# Patient Record
Sex: Female | Born: 1965 | Race: White | Hispanic: No | Marital: Single | Smoking: Never smoker
Health system: Southern US, Community
[De-identification: ages and names within clinical notes are randomized; demographics above are authoritative.]

## PROBLEM LIST (undated history)

## (undated) DIAGNOSIS — F419 Anxiety disorder, unspecified: Secondary | ICD-10-CM

## (undated) DIAGNOSIS — F32A Depression, unspecified: Secondary | ICD-10-CM

## (undated) DIAGNOSIS — F1010 Alcohol abuse, uncomplicated: Secondary | ICD-10-CM

## (undated) DIAGNOSIS — F329 Major depressive disorder, single episode, unspecified: Secondary | ICD-10-CM

## (undated) HISTORY — DX: Anxiety disorder, unspecified: F41.9

## (undated) HISTORY — DX: Alcohol abuse, uncomplicated: F10.10

## (undated) HISTORY — DX: Depression, unspecified: F32.A

---
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
## 1998-09-24 ENCOUNTER — Emergency Department (HOSPITAL_COMMUNITY): Admission: EM | Admit: 1998-09-24 | Discharge: 1998-09-24 | Payer: Self-pay | Admitting: Emergency Medicine

## 1998-09-24 ENCOUNTER — Encounter: Payer: Self-pay | Admitting: Emergency Medicine

## 1999-05-04 ENCOUNTER — Other Ambulatory Visit: Admission: RE | Admit: 1999-05-04 | Discharge: 1999-05-04 | Payer: Self-pay | Admitting: Obstetrics & Gynecology

## 2000-07-09 ENCOUNTER — Other Ambulatory Visit: Admission: RE | Admit: 2000-07-09 | Discharge: 2000-07-09 | Payer: Self-pay | Admitting: Obstetrics & Gynecology

## 2001-07-23 ENCOUNTER — Other Ambulatory Visit: Admission: RE | Admit: 2001-07-23 | Discharge: 2001-07-23 | Payer: Self-pay | Admitting: Obstetrics & Gynecology

## 2002-08-21 ENCOUNTER — Other Ambulatory Visit: Admission: RE | Admit: 2002-08-21 | Discharge: 2002-08-21 | Payer: Self-pay | Admitting: Obstetrics & Gynecology

## 2003-04-01 ENCOUNTER — Ambulatory Visit (HOSPITAL_COMMUNITY): Admission: RE | Admit: 2003-04-01 | Discharge: 2003-04-01 | Payer: Self-pay | Admitting: Obstetrics & Gynecology

## 2003-05-23 DIAGNOSIS — N8003 Adenomyosis of the uterus: Secondary | ICD-10-CM

## 2003-05-23 DIAGNOSIS — N800 Endometriosis of uterus: Secondary | ICD-10-CM

## 2003-05-23 HISTORY — DX: Adenomyosis of the uterus: N80.03

## 2003-05-23 HISTORY — PX: LAPAROSCOPY: SHX197

## 2003-05-23 HISTORY — DX: Endometriosis of uterus: N80.0

## 2003-08-27 ENCOUNTER — Other Ambulatory Visit: Admission: RE | Admit: 2003-08-27 | Discharge: 2003-08-27 | Payer: Self-pay | Admitting: Obstetrics & Gynecology

## 2004-11-16 ENCOUNTER — Other Ambulatory Visit: Admission: RE | Admit: 2004-11-16 | Discharge: 2004-11-16 | Payer: Self-pay | Admitting: Obstetrics & Gynecology

## 2011-03-27 ENCOUNTER — Other Ambulatory Visit: Payer: Self-pay | Admitting: Hematology and Oncology

## 2018-04-23 ENCOUNTER — Encounter: Payer: Self-pay | Admitting: Emergency Medicine

## 2018-04-23 DIAGNOSIS — F419 Anxiety disorder, unspecified: Secondary | ICD-10-CM

## 2018-04-23 DIAGNOSIS — F329 Major depressive disorder, single episode, unspecified: Secondary | ICD-10-CM | POA: Insufficient documentation

## 2018-04-23 DIAGNOSIS — F4310 Post-traumatic stress disorder, unspecified: Secondary | ICD-10-CM | POA: Insufficient documentation

## 2018-05-01 ENCOUNTER — Ambulatory Visit (INDEPENDENT_AMBULATORY_CARE_PROVIDER_SITE_OTHER): Payer: No Typology Code available for payment source | Admitting: Psychiatry

## 2018-05-01 DIAGNOSIS — Z8659 Personal history of other mental and behavioral disorders: Secondary | ICD-10-CM

## 2018-05-01 DIAGNOSIS — F325 Major depressive disorder, single episode, in full remission: Secondary | ICD-10-CM | POA: Diagnosis not present

## 2018-05-01 DIAGNOSIS — F1011 Alcohol abuse, in remission: Secondary | ICD-10-CM | POA: Diagnosis not present
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
## 2018-05-03 ENCOUNTER — Ambulatory Visit: Payer: Self-pay | Admitting: Psychiatry

## 2018-05-07 ENCOUNTER — Ambulatory Visit: Payer: No Typology Code available for payment source | Admitting: Psychiatry

## 2018-05-07 DIAGNOSIS — F419 Anxiety disorder, unspecified: Secondary | ICD-10-CM

## 2018-05-07 DIAGNOSIS — F4310 Post-traumatic stress disorder, unspecified: Secondary | ICD-10-CM

## 2018-05-07 DIAGNOSIS — F325 Major depressive disorder, single episode, in full remission: Secondary | ICD-10-CM | POA: Diagnosis not present

## 2018-05-07 MED ORDER — VILAZODONE HCL 40 MG PO TABS: 40.0000 mg | ORAL_TABLET | Freq: Every day | ORAL | 3 refills | Status: DC

## 2018-05-07 MED ORDER — PRAZOSIN HCL 2 MG PO CAPS: 2.0000 mg | ORAL_CAPSULE | Freq: Every day | ORAL | 3 refills | Status: DC

## 2018-05-07 MED ORDER — BUPROPION HCL ER (XL) 150 MG PO TB24: 150.0000 mg | ORAL_TABLET | Freq: Every day | ORAL | 3 refills | Status: DC

## 2018-05-07 NOTE — Progress Notes (Signed)
[facility]  Patient ID: Amanda Dillon,  MRN: [id number]  PCP: Donald Prose, MD  Date of Evaluation: 05/07/2018 Time spent:20 minutes  Chief Complaint:   HISTORY/CURRENT STATUS: HPI patient seen back in June of this year.  She was doing well.  No change.  She was going to run a marathon in [place] in October of this year.  Her job she has a psychiatric Education officer, museum at [employer]. She continues to do well.  Individual Medical History/ Review of Systems: Changes? :No   Allergies: Patient has no allergy information on record.  Current Medications:  Current Outpatient Medications:  .  buPROPion (WELLBUTRIN XL) 150 MG 24 hr tablet, Take 1 tablet (150 mg total) by mouth daily., Disp: 30 tablet, Rfl: 3 .  prazosin (MINIPRESS) 2 MG capsule, Take 1 capsule (2 mg total) by mouth at bedtime., Disp: 30 capsule, Rfl: 3 .  Vilazodone HCl (VIIBRYD) 40 MG TABS, Take 1 tablet (40 mg total) by mouth daily., Disp: 30 tablet, Rfl: 3 Medication Side Effects: none  Family Medical/ Social History: Changes? No  MENTAL HEALTH EXAM:  There were no vitals taken for this visit.There is no height or weight on file to calculate BMI.  General Appearance: Casual  Eye Contact:  Good  Speech:  Normal Rate  Volume:  Normal  Mood:  Euthymic  Affect:  Appropriate  Thought Process:  Goal Directed  Orientation:  Full (Time, Place, and Person)  Thought Content: WDL   Suicidal Thoughts:  No  Homicidal Thoughts:  No  Memory:  WNL  Judgement:  Good  Insight:  Good  Psychomotor Activity:  Normal  Concentration:  Concentration: Good  Recall:  Good  Fund of Knowledge: Good  Language: Good  Assets:  Desire for Improvement  ADL's:  Intact  Cognition: WNL  Prognosis:  Good    DIAGNOSES:    ICD-10-CM   1. Major depressive disorder in remission, unspecified whether recurrent ([place]) F32.5   2. PTSD (post-traumatic stress disorder) F43.10   3. Anxiety F41.9     Receiving Psychotherapy:  Yes    RECOMMENDATIONS: Ms. she will continue her current treatment regimen and I will see her back in 4 months.   Comer Locket, PA-C

## 2018-07-31 ENCOUNTER — Ambulatory Visit (INDEPENDENT_AMBULATORY_CARE_PROVIDER_SITE_OTHER): Payer: No Typology Code available for payment source | Admitting: Psychiatry

## 2018-07-31 ENCOUNTER — Other Ambulatory Visit: Payer: Self-pay

## 2018-07-31 DIAGNOSIS — Z8659 Personal history of other mental and behavioral disorders: Secondary | ICD-10-CM | POA: Diagnosis not present

## 2018-07-31 DIAGNOSIS — F325 Major depressive disorder, single episode, in full remission: Secondary | ICD-10-CM | POA: Diagnosis not present

## 2018-07-31 DIAGNOSIS — F1011 Alcohol abuse, in remission: Secondary | ICD-10-CM | POA: Diagnosis not present
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
## 2018-09-05 ENCOUNTER — Ambulatory Visit: Payer: PRIVATE HEALTH INSURANCE | Admitting: Psychiatry

## 2018-09-09 ENCOUNTER — Other Ambulatory Visit: Payer: Self-pay | Admitting: Psychiatry

## 2018-09-11 ENCOUNTER — Ambulatory Visit (INDEPENDENT_AMBULATORY_CARE_PROVIDER_SITE_OTHER): Payer: No Typology Code available for payment source | Admitting: Psychiatry

## 2018-09-11 ENCOUNTER — Other Ambulatory Visit: Payer: Self-pay

## 2018-09-11 DIAGNOSIS — F1011 Alcohol abuse, in remission: Secondary | ICD-10-CM

## 2018-09-11 DIAGNOSIS — Z8659 Personal history of other mental and behavioral disorders: Secondary | ICD-10-CM

## 2018-09-11 DIAGNOSIS — F325 Major depressive disorder, single episode, in full remission: Secondary | ICD-10-CM

## 2018-09-11 DIAGNOSIS — Z724 Inappropriate diet and eating habits: Secondary | ICD-10-CM | POA: Diagnosis not present

## 2018-09-11 NOTE — Progress Notes (Signed)
Psychotherapy Progress Note [facility] Luan Moore, PhD LP  Patient ID: Amanda Dillon     MRN: [id number]     Therapy format: Individual psychotherapy Date: 09/11/2018     Start: 6:07p Stop: 6:43p Time Spent: 36 min  Telehealth visit I connected with patient by a video enabled telemedicine/telehealth application or telephone, with her informed consent, and verified patient privacy and that I am speaking with the correct person using two identifiers.  I was located at my home and patient at her workplace.  We discussed the limitations, risks, and security and privacy concerns associated with telehealth services and the availability of in-person appointments, including awareness that she may be responsible for charges related to the service, and she expressed understanding and agreed to proceed.  I discussed treatment planning with her, with opportunity to ask and answer all questions. Agreed with the plan, demonstrated an understanding of the instructions, and made her aware to call our office if symptoms worsen or she feels she is in a crisis state and needs immediate contact.  Session narrative -- presenting needs, interim history, self-report of stressors and symptoms, applications of prior therapy, status changes, and interventions made in session Scheduled early to deal with the death of her 9-year psychiatrist, Amanda Dillon, [place].  Been depression-free for so long now, still has the nagging doubt that she could relapse, though she has been very stable for a few years now, realizes she felt that having him and Amanda Dillon for support has been part of her assurance.  Provided reassurance that she has in fact learned the things she needs and cannot relapse as imagined without some unimagined combination of catastrophes to shake her memory, her confidence, her lifestyle, her sense of worth, and faith all at the same time.  Deeply appreciative of the work done and success together  overcoming alcoholism, depression, PTSD, social anxiety, longstanding low self-esteem, overeating, family mistrust, and finding forgiveness for her father.  Has offered to office mgr to contribute to a patient assistance fund if it exists.  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  More strenuous set of expectations with her promotion and the transition stretching to cover two jobs.  Seeing cause to tighten up diet and exercise habits as she has done some stress eating and weight is creeping up.  Discussed measures to reduce both carbs and carb cravings and encouraged to make time for walking if not running.  Therapeutic modalities: Cognitive Behavioral Therapy and Humanistic/Existential  Mental Status/Observations:  Appearance:   Neat     Behavior:  Appropriate  Motor:  Normal  Speech/Language:   Clear and Coherent  Affect:  Appropriate  Mood:  normal and appropriately saddened  Thought process:  normal  Thought content:    WNL  Sensory/Perceptual disturbances:    WNL  Orientation:  grossly intact  Attention:  Good  Concentration:  Good  Memory:  WNL  Insight:    Good  Judgment:   Good  Impulse Control:  Good   Risk Assessment: Danger to Self:  No Self-injurious Behavior: No Danger to Others: No Duty to Warn:no Physical Aggression / Violence:No  Access to Firearms a concern: No   Diagnosis:   ICD-10-CM   1. Major depressive disorder in full remission, unspecified whether recurrent ([place]) F32.5   2. History of posttraumatic stress disorder (PTSD) Z86.59   3. History of dysthymia Z86.59   4. History of alcohol abuse F10.11  Assessment of progress:  improving  Plan:  . Walking, if not running . Carb and portion control . Open invitation to write or make memorial in Amanda Dillon honor, office will process . Other recommendations/advice as noted above . Continue to utilize previously learned skills ad lib . Maintain  medication as prescribed and work faithfully with relevant prescriber(s) if any changes are desired or seem indicated . Call the clinic on-call service, present to ER, or call 911 if any life-threatening psychiatric crisis Return in about 2 months (around 11/11/2018) for will call.   [name], PhD [place] Licensed Psychologist

## 2018-10-23 ENCOUNTER — Other Ambulatory Visit: Payer: Self-pay

## 2018-10-23 MED ORDER — BUPROPION HCL ER (XL) 150 MG PO TB24: 150.0000 mg | ORAL_TABLET | Freq: Every day | ORAL | 0 refills | Status: DC

## 2018-10-28 ENCOUNTER — Encounter: Payer: Self-pay | Admitting: Physician Assistant

## 2018-10-28 ENCOUNTER — Ambulatory Visit (INDEPENDENT_AMBULATORY_CARE_PROVIDER_SITE_OTHER): Payer: No Typology Code available for payment source | Admitting: Physician Assistant

## 2018-10-28 ENCOUNTER — Other Ambulatory Visit: Payer: Self-pay

## 2018-10-28 DIAGNOSIS — F325 Major depressive disorder, single episode, in full remission: Secondary | ICD-10-CM

## 2018-10-28 DIAGNOSIS — F4310 Post-traumatic stress disorder, unspecified: Secondary | ICD-10-CM

## 2018-10-28 DIAGNOSIS — F1011 Alcohol abuse, in remission: Secondary | ICD-10-CM

## 2018-10-28 MED ORDER — PRAZOSIN HCL 2 MG PO CAPS: 2.0000 mg | ORAL_CAPSULE | Freq: Every day | ORAL | 1 refills | Status: DC

## 2018-10-28 MED ORDER — BUPROPION HCL ER (XL) 150 MG PO TB24: 150.0000 mg | ORAL_TABLET | Freq: Every day | ORAL | 1 refills | Status: DC

## 2018-10-28 MED ORDER — VILAZODONE HCL 40 MG PO TABS: 40.0000 mg | ORAL_TABLET | Freq: Every day | ORAL | 1 refills | Status: DC

## 2018-10-28 NOTE — Progress Notes (Signed)
[facility]  Patient ID: Amanda Dillon,  MRN: [id number]  PCP: Donald Prose, MD  Date of Evaluation: 10/28/2018 Time spent:15 minutes  Chief Complaint:  Chief Complaint    Follow-up     Virtual Visit via Telephone Note  I connected with patient by a video enabled telemedicine application or telephone, with their informed consent, and verified patient privacy and that I am speaking with the correct person using two identifiers.  I am private, in my office and the patient is at work.  I discussed the limitations, risks, security and privacy concerns of performing an evaluation and management service by telephone and the availability of in person appointments. I also discussed with the patient that there may be a patient responsible charge related to this service. The patient expressed understanding and agreed to proceed.   I discussed the assessment and treatment plan with the patient. The patient was provided an opportunity to ask questions and all were answered. The patient agreed with the plan and demonstrated an understanding of the instructions.   The patient was advised to call back or seek an in-person evaluation if the symptoms worsen or if the condition fails to improve as anticipated.  I provided 15 minutes of non-face-to-face time during this encounter.  HISTORY/CURRENT STATUS: HPI For 6 month med check.  Transferring to my care because her provider and my colleague, Comer Locket, [place], passed away recently.  "I can't remember the last time I was depressed.  It feels good to be able to say that!"  She is able to enjoy things.  Energy and motivation are good.  She sleeps well.  Does not cry easily.  Denies nightmares.  Denies dizziness, syncope, seizures, numbness, tingling, tremor, tics, unsteady gait, slurred speech, confusion. Denies muscle or joint pain, stiffness, or dystonia.  Individual Medical History/ Review of Systems: Changes? :No    Past medications for  mental health diagnoses include: unknown  Allergies: Patient has no known allergies.  Current Medications:  Current Outpatient Medications:  .  buPROPion (WELLBUTRIN XL) 150 MG 24 hr tablet, Take 1 tablet (150 mg total) by mouth daily., Disp: 90 tablet, Rfl: 1 .  prazosin (MINIPRESS) 2 MG capsule, Take 1 capsule (2 mg total) by mouth at bedtime., Disp: 90 capsule, Rfl: 1 .  Vilazodone HCl (VIIBRYD) 40 MG TABS, Take 1 tablet (40 mg total) by mouth daily., Disp: 90 tablet, Rfl: 1 Medication Side Effects: none  Family Medical/ Social History: Changes? No  MENTAL HEALTH EXAM:  There were no vitals taken for this visit.There is no height or weight on file to calculate BMI.  General Appearance: Unable to assess  Eye Contact:  Unable to assess  Speech:  Clear and Coherent  Volume:  Normal  Mood:  Euthymic  Affect:  Unable to assess  Thought Process:  Goal Directed  Orientation:  Full (Time, Place, and Person)  Thought Content: Logical   Suicidal Thoughts:  No  Homicidal Thoughts:  No  Memory:  WNL  Judgement:  Good  Insight:  Good  Psychomotor Activity:  Unable to assess  Concentration:  Concentration: Good  Recall:  Good  Fund of Knowledge: Good  Language: Good  Assets:  Desire for Improvement  ADL's:  Intact  Cognition: WNL  Prognosis:  Good    DIAGNOSES:    ICD-10-CM   1. Major depressive disorder in full remission, unspecified whether recurrent ([place]) F32.5   2. History of alcohol abuse F10.11   3. PTSD (post-traumatic stress disorder)  F43.10     Receiving Psychotherapy: Yes    RECOMMENDATIONS:  Continue Wellbutrin XL 150 mg p.o. daily. Continue prazosin 2 mg nightly. Continue Viibryd 40 mg daily. Continue psychotherapy. Return in 6 months.  Donnal Moat, PA-C   This record has been created using [employer].  Chart creation errors have been sought, but may not always have been located and corrected. Such creation errors do not reflect on the standard of  medical care.

## 2019-02-26 ENCOUNTER — Other Ambulatory Visit: Payer: Self-pay

## 2019-02-26 ENCOUNTER — Ambulatory Visit (INDEPENDENT_AMBULATORY_CARE_PROVIDER_SITE_OTHER): Payer: No Typology Code available for payment source | Admitting: Psychiatry

## 2019-02-26 DIAGNOSIS — F1021 Alcohol dependence, in remission: Secondary | ICD-10-CM

## 2019-02-26 DIAGNOSIS — F5102 Adjustment insomnia: Secondary | ICD-10-CM

## 2019-02-26 DIAGNOSIS — Z8659 Personal history of other mental and behavioral disorders: Secondary | ICD-10-CM

## 2019-02-26 DIAGNOSIS — F5089 Other specified eating disorder: Secondary | ICD-10-CM

## 2019-02-26 DIAGNOSIS — F411 Generalized anxiety disorder: Secondary | ICD-10-CM

## 2019-02-26 DIAGNOSIS — F3341 Major depressive disorder, recurrent, in partial remission: Secondary | ICD-10-CM

## 2019-02-26 DIAGNOSIS — F1520 Other stimulant dependence, uncomplicated: Secondary | ICD-10-CM
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
## 2019-03-21 ENCOUNTER — Other Ambulatory Visit: Payer: Self-pay

## 2019-03-21 ENCOUNTER — Ambulatory Visit
Admission: RE | Admit: 2019-03-21 | Discharge: 2019-03-21 | Disposition: A | Discharge status: Home / Self Care | Payer: PRIVATE HEALTH INSURANCE | Source: Ambulatory Visit | Attending: Family Medicine | Admitting: Family Medicine

## 2019-03-21 ENCOUNTER — Other Ambulatory Visit: Payer: Self-pay | Admitting: Family Medicine

## 2019-03-21 DIAGNOSIS — R1032 Left lower quadrant pain: Secondary | ICD-10-CM

## 2019-03-24 ENCOUNTER — Other Ambulatory Visit: Payer: PRIVATE HEALTH INSURANCE

## 2019-03-24 ENCOUNTER — Other Ambulatory Visit: Payer: Self-pay | Admitting: Family Medicine

## 2019-03-24 ENCOUNTER — Telehealth: Payer: Self-pay | Admitting: *Deleted

## 2019-03-24 DIAGNOSIS — N9489 Other specified conditions associated with female genital organs and menstrual cycle: Secondary | ICD-10-CM

## 2019-03-24 DIAGNOSIS — R1032 Left lower quadrant pain: Secondary | ICD-10-CM

## 2019-03-24 NOTE — Telephone Encounter (Signed)
Called and spoke with the patient, scheduled a new patient appt for 11/13 at [facility] the address and phone number; along with the policy for parking, mask and no visitors.

## 2019-03-26 ENCOUNTER — Other Ambulatory Visit: Payer: Self-pay

## 2019-03-26 ENCOUNTER — Ambulatory Visit (INDEPENDENT_AMBULATORY_CARE_PROVIDER_SITE_OTHER): Payer: No Typology Code available for payment source | Admitting: Psychiatry

## 2019-03-26 DIAGNOSIS — N83202 Unspecified ovarian cyst, left side: Secondary | ICD-10-CM

## 2019-03-26 DIAGNOSIS — Z724 Inappropriate diet and eating habits: Secondary | ICD-10-CM

## 2019-03-26 DIAGNOSIS — F3341 Major depressive disorder, recurrent, in partial remission: Secondary | ICD-10-CM | POA: Diagnosis not present

## 2019-03-26 DIAGNOSIS — F1021 Alcohol dependence, in remission: Secondary | ICD-10-CM

## 2019-03-26 DIAGNOSIS — F411 Generalized anxiety disorder: Secondary | ICD-10-CM | POA: Diagnosis not present

## 2019-03-26 DIAGNOSIS — Z8659 Personal history of other mental and behavioral disorders: Secondary | ICD-10-CM | POA: Diagnosis not present
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
## 2019-04-03 ENCOUNTER — Ambulatory Visit
Admission: RE | Admit: 2019-04-03 | Discharge: 2019-04-03 | Disposition: A | Discharge status: Home / Self Care | Payer: Self-pay | Source: Ambulatory Visit | Attending: Gynecologic Oncology | Admitting: Gynecologic Oncology

## 2019-04-03 ENCOUNTER — Other Ambulatory Visit: Payer: Self-pay

## 2019-04-03 DIAGNOSIS — N9489 Other specified conditions associated with female genital organs and menstrual cycle: Secondary | ICD-10-CM

## 2019-04-04 ENCOUNTER — Other Ambulatory Visit: Payer: Self-pay | Admitting: Gynecologic Oncology

## 2019-04-04 ENCOUNTER — Other Ambulatory Visit: Payer: Self-pay

## 2019-04-04 ENCOUNTER — Inpatient Hospital Stay: Payer: PRIVATE HEALTH INSURANCE

## 2019-04-04 ENCOUNTER — Inpatient Hospital Stay: Payer: PRIVATE HEALTH INSURANCE | Attending: Gynecologic Oncology | Admitting: Gynecologic Oncology

## 2019-04-04 ENCOUNTER — Encounter: Payer: Self-pay | Admitting: Gynecologic Oncology

## 2019-04-04 VITALS — BP 155/86 | HR 68 | Temp 98.0°F | Resp 16 | Ht 66.0 in | Wt 196.0 lb

## 2019-04-04 DIAGNOSIS — N9489 Other specified conditions associated with female genital organs and menstrual cycle: Secondary | ICD-10-CM

## 2019-04-04 DIAGNOSIS — D398 Neoplasm of uncertain behavior of other specified female genital organs: Secondary | ICD-10-CM | POA: Diagnosis not present

## 2019-04-04 MED ORDER — SENNOSIDES-DOCUSATE SODIUM 8.6-50 MG PO TABS: 2.0000 | ORAL_TABLET | Freq: Every day | ORAL | 1 refills | Status: DC

## 2019-04-04 MED ORDER — TRAMADOL HCL 50 MG PO TABS: 50.0000 mg | ORAL_TABLET | Freq: Four times a day (QID) | ORAL | 0 refills | Status: DC | PRN

## 2019-04-04 MED ORDER — IBUPROFEN 800 MG PO TABS: 800.0000 mg | ORAL_TABLET | Freq: Three times a day (TID) | ORAL | 0 refills | Status: DC | PRN

## 2019-04-04 NOTE — Progress Notes (Signed)
Consult Note: Gyn-Onc  Consult was requested by Dr. Nori Riis for the evaluation of Amanda Dillon 53 y.o. female  CC:  Chief Complaint  Patient presents with  . Adnexal mass    Assessment/Plan:  Ms. Amanda Dillon  is a 54 y.o.  year old with a 7 cm complex but mostly cystic left ovarian lesion.  CA-125 pending.  I performed a history, physical examination, and personally reviewed the patient's imaging films including the CT scan from October 30th and the MRI from November 9th. I reviewed these with the patient.   I discussed that I have a low suspicion for malignancy.  However this lesion is symptomatic and given its size I think it is reasonable to proceed with surgical excision.  We will plan on a robotic assisted left salpingo-oophorectomy.  If the ovary is densely adherent to the uterus or if malignancy is found she will have a hysterectomy with BSO.  If pathology is benign and is feasible to remove the left ovary alone we will preserve her uterus and contralateral ovary.  I discussed surgical risks including  bleeding, infection, damage to internal organs (such as bladder,ureters, bowels), blood clot, reoperation and rehospitalization. She has a history of alcoholism 10 years ago, we will check coags prior to surgery and LFTs.  We will avoid opioids at the patient's request.  HPI: Ms. Amanda Dillon is a very pleasant 53 year old P0 who is seen in consultation at the request of Dr. Nori Riis. She reported feeling symptoms of pelvic pain, particularly on the left in October 2020.  This prompted her to have a CT scan of the abdomen and pelvis performed on March 21, 2019 which revealed a large solitary gallstone and a uterus that was unremarkable, no right adnexal abnormality, or bilobed cystic lesion measuring 10.3 x 8.1 cm seen in the left adnexal region.   MRI on November 9th, 2020 showed a complex multiseptated cyst within the left adnexa with ill-defined enhancement along the posterior medial  margin favored to represent ovarian tissue.  There is also relatively simple appearing at largest cystic mass in the anterior pelvis.  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  The patient has never been pregnant.  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  She lives alone.  She has a remote history of alcoholism but has been sober for 10 years prior to this diagnosis.  She suffers from no sequelae from her history of alcohol abuse.  She has a prior surgical history significant for a laparoscopy performed by Dr. Nori Riis remotely for what she remembers being told was adenomyosis.  This was 20 years ago.  She denies hearing that it was endometriosis.   Current Meds:  Outpatient Encounter Medications as of 04/04/2019  Medication Sig  . buPROPion (WELLBUTRIN XL) 150 MG 24 hr tablet Take 1 tablet (150 mg total) by mouth daily. (Patient not taking: Reported on 04/04/2019)  . prazosin (MINIPRESS) 2 MG capsule Take 1 capsule (2 mg total) by mouth at bedtime. (Patient not taking: Reported on 04/04/2019)  . [DISCONTINUED] ibuprofen (ADVIL) 200 MG tablet Take 600 mg by mouth every 6 (six) hours as needed.  . [DISCONTINUED] naproxen sodium (ALEVE) 220 MG tablet Take 220 mg by mouth 3 (three) times daily.  Marland Kitchen ibuprofen (ADVIL) 800 MG tablet Take 1 tablet (800 mg total) by mouth every 8 (eight) hours as needed for moderate pain. FOR AFTER SURGERY  . senna-docusate (SENOKOT-S) 8.6-50 MG tablet Take 2 tablets  by mouth at bedtime. FOR AFTER SURGERY, DO NOT TAKE IF HAVING DIARRHEA  . traMADol (ULTRAM) 50 MG tablet Take 1 tablet (50 mg total) by mouth every 6 (six) hours as needed for severe pain. FOR AFTER SURGERY, DO NOT TAKE AND DRIVE  . Vilazodone HCl (VIIBRYD) 40 MG TABS Take 1 tablet (40 mg total) by mouth daily.   No facility-administered encounter medications on file as of 04/04/2019.     Allergy: No Known Allergies  Social Hx:   Social History   Socioeconomic  History  . Marital status: Single    Spouse name: Not on file  . Number of children: Not on file  . Years of education: Not on file  . Highest education level: Not on file  Occupational History  . Not on file  Social Needs  . Financial resource strain: Not on file  . Food insecurity    Worry: Not on file    Inability: Not on file  . Transportation needs    Medical: Not on file    Non-medical: Not on file  Tobacco Use  . Smoking status: Never Smoker  . Smokeless tobacco: Never Used  Substance and Sexual Activity  . Alcohol use: Not Currently    Frequency: Never    Comment: "I'm an alcoholic but sober for 10 years."  . Drug use: Never  . Sexual activity: Not on file  Lifestyle  . Physical activity    Days per week: Not on file    Minutes per session: Not on file  . Stress: Not on file  Relationships  . Social Herbalist on phone: Not on file    Gets together: Not on file    Attends religious service: Not on file    Active member of club or organization: Not on file    Attends meetings of clubs or organizations: Not on file    Relationship status: Not on file  . Intimate partner violence    Fear of current or ex partner: Not on file    Emotionally abused: Not on file    Physically abused: Not on file    Forced sexual activity: Not on file  Other Topics Concern  . Not on file  Social History Narrative  . Not on file    Past Surgical Hx:  Past Surgical History:  Procedure Laterality Date  . LAPAROSCOPY N/A 05/23/2003    Past Medical Hx:  Past Medical History:  Diagnosis Date  . Adenomyosis 05/23/2003  . Depression     Past Gynecological History:  See HPi No LMP recorded.  Family Hx: History reviewed. No pertinent family history.  Review of Systems:  Constitutional  Feels well,    ENT Normal appearing ears and nares bilaterally Skin/Breast  No rash, sores, jaundice, itching, dryness Cardiovascular  No chest pain, shortness of breath, or  edema  Pulmonary  No cough or wheeze.  Gastro Intestinal  No nausea, vomitting, or diarrhoea. No bright red blood per rectum, no abdominal pain, change in bowel movement, or constipation.  Genito Urinary  No frequency, urgency, dysuria, + left pelvic pain Musculo Skeletal  No myalgia, arthralgia, joint swelling or pain  Neurologic  No weakness, numbness, change in gait,  Psychology  No depression, anxiety, insomnia.   Vitals:  Blood pressure (!) 155/86, pulse 68, temperature 98 F (36.7 C), temperature source Temporal, resp. rate 16, height 5\' 6"  (1.676 m), weight 196 lb (88.9 kg), SpO2 98 %.  Physical Exam: WD  in NAD Neck  Supple NROM, without any enlargements.  Lymph Node Survey No cervical supraclavicular or inguinal adenopathy Cardiovascular  Pulse normal rate, regularity and rhythm. S1 and S2 normal.  Lungs  Clear to auscultation bilateraly, without wheezes/crackles/rhonchi. Good air movement.  Skin  No rash/lesions/breakdown  Psychiatry  Alert and oriented to person, place, and time  Abdomen  Normoactive bowel sounds, abdomen soft, non-tender and obese without evidence of hernia.  Back No CVA tenderness Genito Urinary  Vulva/vagina: Normal external female genitalia.   No lesions. No discharge or bleeding.  Bladder/urethra:  No lesions or masses, well supported bladder  Vagina: normal  Cervix: Normal appearing, no lesions.  Uterus:  Small, mobile, no parametrial involvement or nodularity.  Adnexa: unable to feel masses. Rectal  Good tone, no masses no cul de sac nodularity.  Extremities  No bilateral cyanosis, clubbing or edema.   Thereasa Solo, MD  04/04/2019, 4:54 PM

## 2019-04-04 NOTE — Patient Instructions (Signed)
Preparing for your Surgery  Plan for surgery on April 14, 2019 with Dr. Everitt Amber at [facility] will be scheduled for a robotic assisted left salpingo-oophorectomy, possible total laparoscopic hysterectomy, possible staging.   Pre-operative Testing -You will receive a phone call from presurgical testing at [facility] if you have not received a call already to arrange for a pre-operative testing appointment before your surgery.  This appointment normally occurs one to two weeks before your scheduled surgery.   -Bring your insurance card, copy of an advanced directive if applicable, medication list  -At that visit, you will be asked to sign a consent for a possible blood transfusion in case a transfusion becomes necessary during surgery.  The need for a blood transfusion is rare but having consent is a necessary part of your care.     -You should not be taking blood thinners or aspirin at least ten days prior to surgery unless instructed by your surgeon.  -Do not take supplements such as fish oil (omega 3), red yeast rice, tumeric before your surgery.  Day Before Surgery at [place] will be asked to take in a light diet the day before surgery.  Avoid carbonated beverages.  You will be advised to have nothing to eat or drink after midnight the evening before.    Eat a light diet the day before surgery.  Examples including soups, broths, toast, yogurt, mashed potatoes.  Things to avoid include carbonated beverages (fizzy beverages), raw fruits and raw vegetables, or beans.   If your bowels are filled with gas, your surgeon will have difficulty visualizing your pelvic organs which increases your surgical risks.  Your role in recovery Your role is to become active as soon as directed by your doctor, while still giving yourself time to heal.  Rest when you feel tired. You will be asked to do the following in order to speed your recovery:  - Cough and breathe deeply.  This helps toclear and expand your lungs and can prevent pneumonia.  - Do mild physical activity. Walking or moving your legs help your circulation and body functions return to normal. A staff member will help you when you try to walk and will provide you with simple exercises. Do not try to get up or walk alone the first time. - Actively manage your pain. Managing your pain lets you move in comfort. We will ask you to rate your pain on a scale of zero to 10. It is your responsibility to tell your doctor or nurse where and how much you hurt so your pain can be treated.  Special Considerations -If you are diabetic, you may be placed on insulin after surgery to have closer control over your blood sugars to promote healing and recovery.  This does not mean that you will be discharged on insulin.  If applicable, your oral antidiabetics will be resumed when you are tolerating a solid diet.  -Your final pathology results from surgery should be available around one week after surgery and the results will be relayed to you when available.  -FMLA forms can be faxed to [phone] and please allow 5-7 business days for completion.  Pain Management After Surgery -You have been prescribed your pain medication and bowel regimen medications before surgery so that you can have these available when you are discharged from the hospital. The pain medication is for use ONLY AFTER surgery and a new prescription will not be given.   -Make sure that  you have Tylenol and Ibuprofen at home to use on a regular basis after surgery for pain control. We recommend alternating the medications every hour to six hours since they work differently and are processed in the body differently for pain relief.  -Review the attached handout on narcotic use and their risks and side effects.   Bowel Regimen -You have been prescribed Sennakot-S to take nightly to prevent constipation especially if you are taking the narcotic pain  medication intermittently.  It is important to prevent constipation and drink adequate amounts of liquids.  Blood Transfusion Information WHAT IS A BLOOD TRANSFUSION? A transfusion is the replacement of blood or some of its parts. Blood is made up of multiple cells which provide different functions.  Red blood cells carry oxygen and are used for blood loss replacement.  White blood cells fight against infection.  Platelets control bleeding.  Plasma helps clot blood.  Other blood products are available for specialized needs, such as hemophilia or other clotting disorders. BEFORE THE TRANSFUSION  Who gives blood for transfusions?   You may be able to donate blood to be used at a later date on yourself (autologous donation).  Relatives can be asked to donate blood. This is generally not any safer than if you have received blood from a stranger. The same precautions are taken to ensure safety when a relative's blood is donated.  Healthy volunteers who are fully evaluated to make sure their blood is safe. This is blood bank blood. Transfusion therapy is the safest it has ever been in the practice of medicine. Before blood is taken from a donor, a complete history is taken to make sure that person has no history of diseases nor engages in risky social behavior (examples are intravenous drug use or sexual activity with multiple partners). The donor's travel history is screened to minimize risk of transmitting infections, such as malaria. The donated blood is tested for signs of infectious diseases, such as HIV and hepatitis. The blood is then tested to be sure it is compatible with you in order to minimize the chance of a transfusion reaction. If you or a relative donates blood, this is often done in anticipation of surgery and is not appropriate for emergency situations. It takes many days to process the donated blood. RISKS AND COMPLICATIONS Although transfusion therapy is very safe and saves  many lives, the main dangers of transfusion include:   Getting an infectious disease.  Developing a transfusion reaction. This is an allergic reaction to something in the blood you were given. Every precaution is taken to prevent this. The decision to have a blood transfusion has been considered carefully by your caregiver before blood is given. Blood is not given unless the benefits outweigh the risks.  After Surgery Instructions  04/04/2019  Return to work: 4-6 weeks if applicable  Activity: 1. Be up and out of the bed during the day.  Take a nap if needed.  You may walk up steps but be careful and use the hand rail.  Stair climbing will tire you more than you think, you may need to stop part way and rest.   2. No lifting or straining for 6 weeks.  3. No driving for 1 week(s).  Do not drive if you are taking narcotic pain medicine.  4. Shower daily.  Use soap and water on your incision and pat dry; don't rub.  No tub baths until cleared by your surgeon.   5. IF YOU HAVE A  HYSTERECTOMY: No sexual activity and nothing in the vagina for 8 weeks.  6. You may experience a small amount of clear drainage from your incisions, which is normal.  If the drainage persists or increases, please call the office.  7. IF YOU HAVE A HYSTERECTOMY: You may experience vaginal spotting after surgery or around the 6-8 week mark from surgery when the stitches at the top of the vagina begin to dissolve.  The spotting is normal but if you experience heavy bleeding, call our office.  8. Take Tylenol or ibuprofen first for pain and only use narcotic pain medication for severe pain not relieved by the Tylenol or Ibuprofen.  Monitor your Tylenol intake to a max of 4,000 mg.  Diet: 1. Low sodium Heart Healthy Diet is recommended.  2. It is safe to use a laxative, such as Miralax or Colace, if you have difficulty moving your bowels. You can take Sennakot at bedtime every evening to keep bowel movements regular  and to prevent constipation.    Wound Care: 1. Keep clean and dry.  Shower daily.  Reasons to call the Doctor:  Fever - Oral temperature greater than 100.4 degrees Fahrenheit  Foul-smelling vaginal discharge  Difficulty urinating  Nausea and vomiting  Increased pain at the site of the incision that is unrelieved with pain medicine.  Difficulty breathing with or without chest pain  New calf pain especially if only on one side  Sudden, continuing increased vaginal bleeding with or without clots.   Contacts: For questions or concerns you should contact:  Dr. Everitt Amber at [phone]  Joylene John, NP at [phone]  After Hours: call [phone] and have the GYN Oncologist paged/contacted

## 2019-04-04 NOTE — H&P (View-Only) (Signed)
Consult Note: Gyn-Onc  Consult was requested by Dr. Nori Riis for the evaluation of Mee Hives 53 y.o. female  CC:  Chief Complaint  Patient presents with  . Adnexal mass    Assessment/Plan:  Ms. Abi Baas  is a 53 y.o.  year old with a 7 cm complex but mostly cystic left ovarian lesion.  CA-125 pending.  I performed a history, physical examination, and personally reviewed the patient's imaging films including the CT scan from October 30th and the MRI from November 9th. I reviewed these with the patient.   I discussed that I have a low suspicion for malignancy.  However this lesion is symptomatic and given its size I think it is reasonable to proceed with surgical excision.  We will plan on a robotic assisted left salpingo-oophorectomy.  If the ovary is densely adherent to the uterus or if malignancy is found she will have a hysterectomy with BSO.  If pathology is benign and is feasible to remove the left ovary alone we will preserve her uterus and contralateral ovary.  I discussed surgical risks including  bleeding, infection, damage to internal organs (such as bladder,ureters, bowels), blood clot, reoperation and rehospitalization. She has a history of alcoholism 10 years ago, we will check coags prior to surgery and LFTs.  We will avoid opioids at the patient's request.  HPI: Ms. Jonnah Mcleish is a very pleasant 53 year old P0 who is seen in consultation at the request of Dr. Nori Riis. She reported feeling symptoms of pelvic pain, particularly on the left in October 2020.  This prompted her to have a CT scan of the abdomen and pelvis performed on March 21, 2019 which revealed a large solitary gallstone and a uterus that was unremarkable, no right adnexal abnormality, or bilobed cystic lesion measuring 10.3 x 8.1 cm seen in the left adnexal region.   MRI on November 9th, 2020 showed a complex multiseptated cyst within the left adnexa with ill-defined enhancement along the posterior medial  margin favored to represent ovarian tissue.  There is also relatively simple appearing at largest cystic mass in the anterior pelvis.  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  The patient has never been pregnant.  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  She lives alone.  She has a remote history of alcoholism but has been sober for 10 years prior to this diagnosis.  She suffers from no sequelae from her history of alcohol abuse.  She has a prior surgical history significant for a laparoscopy performed by Dr. Nori Riis remotely for what she remembers being told was adenomyosis.  This was 20 years ago.  She denies hearing that it was endometriosis.   Current Meds:  Outpatient Encounter Medications as of 04/04/2019  Medication Sig  . buPROPion (WELLBUTRIN XL) 150 MG 24 hr tablet Take 1 tablet (150 mg total) by mouth daily. (Patient not taking: Reported on 04/04/2019)  . prazosin (MINIPRESS) 2 MG capsule Take 1 capsule (2 mg total) by mouth at bedtime. (Patient not taking: Reported on 04/04/2019)  . [DISCONTINUED] ibuprofen (ADVIL) 200 MG tablet Take 600 mg by mouth every 6 (six) hours as needed.  . [DISCONTINUED] naproxen sodium (ALEVE) 220 MG tablet Take 220 mg by mouth 3 (three) times daily.  Marland Kitchen ibuprofen (ADVIL) 800 MG tablet Take 1 tablet (800 mg total) by mouth every 8 (eight) hours as needed for moderate pain. FOR AFTER SURGERY  . senna-docusate (SENOKOT-S) 8.6-50 MG tablet Take 2 tablets  by mouth at bedtime. FOR AFTER SURGERY, DO NOT TAKE IF HAVING DIARRHEA  . traMADol (ULTRAM) 50 MG tablet Take 1 tablet (50 mg total) by mouth every 6 (six) hours as needed for severe pain. FOR AFTER SURGERY, DO NOT TAKE AND DRIVE  . Vilazodone HCl (VIIBRYD) 40 MG TABS Take 1 tablet (40 mg total) by mouth daily.   No facility-administered encounter medications on file as of 04/04/2019.     Allergy: No Known Allergies  Social Hx:   Social History   Socioeconomic  History  . Marital status: Single    Spouse name: Not on file  . Number of children: Not on file  . Years of education: Not on file  . Highest education level: Not on file  Occupational History  . Not on file  Social Needs  . Financial resource strain: Not on file  . Food insecurity    Worry: Not on file    Inability: Not on file  . Transportation needs    Medical: Not on file    Non-medical: Not on file  Tobacco Use  . Smoking status: Never Smoker  . Smokeless tobacco: Never Used  Substance and Sexual Activity  . Alcohol use: Not Currently    Frequency: Never    Comment: "I'm an alcoholic but sober for 10 years."  . Drug use: Never  . Sexual activity: Not on file  Lifestyle  . Physical activity    Days per week: Not on file    Minutes per session: Not on file  . Stress: Not on file  Relationships  . Social Herbalist on phone: Not on file    Gets together: Not on file    Attends religious service: Not on file    Active member of club or organization: Not on file    Attends meetings of clubs or organizations: Not on file    Relationship status: Not on file  . Intimate partner violence    Fear of current or ex partner: Not on file    Emotionally abused: Not on file    Physically abused: Not on file    Forced sexual activity: Not on file  Other Topics Concern  . Not on file  Social History Narrative  . Not on file    Past Surgical Hx:  Past Surgical History:  Procedure Laterality Date  . LAPAROSCOPY N/A 05/23/2003    Past Medical Hx:  Past Medical History:  Diagnosis Date  . Adenomyosis 05/23/2003  . Depression     Past Gynecological History:  See HPi No LMP recorded.  Family Hx: History reviewed. No pertinent family history.  Review of Systems:  Constitutional  Feels well,    ENT Normal appearing ears and nares bilaterally Skin/Breast  No rash, sores, jaundice, itching, dryness Cardiovascular  No chest pain, shortness of breath, or  edema  Pulmonary  No cough or wheeze.  Gastro Intestinal  No nausea, vomitting, or diarrhoea. No bright red blood per rectum, no abdominal pain, change in bowel movement, or constipation.  Genito Urinary  No frequency, urgency, dysuria, + left pelvic pain Musculo Skeletal  No myalgia, arthralgia, joint swelling or pain  Neurologic  No weakness, numbness, change in gait,  Psychology  No depression, anxiety, insomnia.   Vitals:  Blood pressure (!) 155/86, pulse 68, temperature 98 F (36.7 C), temperature source Temporal, resp. rate 16, height 5\' 6"  (1.676 m), weight 196 lb (88.9 kg), SpO2 98 %.  Physical Exam: WD  in NAD Neck  Supple NROM, without any enlargements.  Lymph Node Survey No cervical supraclavicular or inguinal adenopathy Cardiovascular  Pulse normal rate, regularity and rhythm. S1 and S2 normal.  Lungs  Clear to auscultation bilateraly, without wheezes/crackles/rhonchi. Good air movement.  Skin  No rash/lesions/breakdown  Psychiatry  Alert and oriented to person, place, and time  Abdomen  Normoactive bowel sounds, abdomen soft, non-tender and obese without evidence of hernia.  Back No CVA tenderness Genito Urinary  Vulva/vagina: Normal external female genitalia.   No lesions. No discharge or bleeding.  Bladder/urethra:  No lesions or masses, well supported bladder  Vagina: normal  Cervix: Normal appearing, no lesions.  Uterus:  Small, mobile, no parametrial involvement or nodularity.  Adnexa: unable to feel masses. Rectal  Good tone, no masses no cul de sac nodularity.  Extremities  No bilateral cyanosis, clubbing or edema.   Thereasa Solo, MD  04/04/2019, 4:54 PM

## 2019-04-05 LAB — CA 125: Cancer Antigen (CA) 125: 23.5 U/mL (ref 0.0–38.1)

## 2019-04-06 ENCOUNTER — Encounter (HOSPITAL_COMMUNITY): Payer: Self-pay | Admitting: Emergency Medicine

## 2019-04-06 ENCOUNTER — Other Ambulatory Visit: Payer: Self-pay

## 2019-04-06 ENCOUNTER — Emergency Department (HOSPITAL_COMMUNITY)
Admission: EM | Admit: 2019-04-06 | Discharge: 2019-04-06 | Disposition: A | Discharge status: Home / Self Care | Payer: PRIVATE HEALTH INSURANCE | Attending: Emergency Medicine | Admitting: Emergency Medicine

## 2019-04-06 DIAGNOSIS — Z79899 Other long term (current) drug therapy: Secondary | ICD-10-CM | POA: Insufficient documentation

## 2019-04-06 DIAGNOSIS — R1032 Left lower quadrant pain: Secondary | ICD-10-CM

## 2019-04-06 DIAGNOSIS — N83202 Unspecified ovarian cyst, left side: Secondary | ICD-10-CM | POA: Diagnosis not present

## 2019-04-06 MED ORDER — HYDROCODONE-ACETAMINOPHEN 5-325 MG PO TABS: 2.0000 | ORAL_TABLET | ORAL | 0 refills | Status: DC | PRN

## 2019-04-06 MED ORDER — MORPHINE SULFATE (PF) 4 MG/ML IV SOLN
4.0000 mg | Freq: Once | INTRAVENOUS | Status: AC
  Administered 2019-04-06: 4 mg via INTRAMUSCULAR
  Filled 2019-04-06: qty 1

## 2019-04-06 NOTE — ED Triage Notes (Signed)
Patient here from home with complaints of left ovarian cyst pain. Reports that she is suppose to have surgery for removal of the left ovary on 11/23.

## 2019-04-06 NOTE — Discharge Instructions (Addendum)
Please return for any problem. Follow up with your regular care provider (Dr. Denman George, [place]) as instructed.

## 2019-04-06 NOTE — ED Provider Notes (Signed)
[place] DEPT Provider Note   CSN: [id number] Arrival date & time: 04/06/19  1258     History   Chief Complaint Chief Complaint  Patient presents with  . Ovarian Cyst    HPI Amanda Dillon is a 53 y.o. female.     53 year old female with prior medical history as detailed below presents for evaluation of left lower quadrant abdominal pain.  Patient is well known to the [facility]  service.  She is currently scheduled for removal of complex left ovarian cyst (7cm) on November 23, with Dr. Denman George.  Patient reports that she has been taking ibuprofen and Aleve at home for control of her pain.  She was advised to stop these medications pending her upcoming surgery.  She was advised to use Tylenol alone for pain.  She is reporting that Tylenol is not adequate for pain control (last dose at noon today).  She discussed the situation with the on-call Gyn-Onc nursing service today and was told to come to the ED for possible pain control.   The history is provided by the patient and medical records.  Illness Location:  LLQ pain Severity:  Mild Onset quality:  Gradual Timing:  Constant Progression:  Unchanged Chronicity:  Recurrent   Past Medical History:  Diagnosis Date  . Adenomyosis 05/23/2003  . Depression     Patient Active Problem List   Diagnosis Date Noted  . MDD (major depressive disorder) 04/23/2018  . Anxiety 04/23/2018  . PTSD (post-traumatic stress disorder) 04/23/2018    Past Surgical History:  Procedure Laterality Date  . LAPAROSCOPY N/A 05/23/2003     OB History   No obstetric history on file.      Home Medications    Prior to Admission medications   Medication Sig Start Date End Date Taking? Authorizing Provider  buPROPion (WELLBUTRIN XL) 150 MG 24 hr tablet Take 1 tablet (150 mg total) by mouth daily. Patient not taking: Reported on 04/04/2019 10/28/18   Donnal Moat T, PA-C  buPROPion (WELLBUTRIN XL) 300 MG 24 hr tablet  Take 300 mg by mouth daily.    [provider]  ibuprofen (ADVIL) 800 MG tablet Take 1 tablet (800 mg total) by mouth every 8 (eight) hours as needed for moderate pain. FOR AFTER SURGERY 04/04/19   Joylene John D, NP  prazosin (MINIPRESS) 1 MG capsule Take 1 mg by mouth at bedtime.    [provider]  prazosin (MINIPRESS) 2 MG capsule Take 1 capsule (2 mg total) by mouth at bedtime. Patient not taking: Reported on 04/04/2019 10/28/18   Donnal Moat T, PA-C  senna-docusate (SENOKOT-S) 8.6-50 MG tablet Take 2 tablets by mouth at bedtime. FOR AFTER SURGERY, DO NOT TAKE IF HAVING DIARRHEA 04/04/19   Cross, Lenna Sciara D, NP  traMADol (ULTRAM) 50 MG tablet Take 1 tablet (50 mg total) by mouth every 6 (six) hours as needed for severe pain. FOR AFTER SURGERY, DO NOT TAKE AND DRIVE [id number]   Joylene John D, NP  Vilazodone HCl (VIIBRYD) 40 MG TABS Take 1 tablet (40 mg total) by mouth daily. 10/28/18   Addison Lank, PA-C    Family History No family history on file.  Social History Social History   Tobacco Use  . Smoking status: Never Smoker  . Smokeless tobacco: Never Used  Substance Use Topics  . Alcohol use: Not Currently    Frequency: Never    Comment: "I'm an alcoholic but sober for 10 years."  . Drug use: Never  Allergies   Patient has no known allergies.   Review of Systems Review of Systems  All other systems reviewed and are negative.    Physical Exam Updated Vital Signs BP (!) 160/83 (BP Location: Right Arm)   Pulse 68   Temp 98.4 F (36.9 C) (Oral)   Resp 16   Ht 5\' 6"  (1.676 m)   Wt 88.5 kg   SpO2 97%   BMI 31.47 kg/m   Physical Exam Vitals signs and nursing note reviewed.  Constitutional:      General: She is not in acute distress.    Appearance: She is well-developed.  HENT:     Head: Normocephalic and atraumatic.  Eyes:     Conjunctiva/sclera: Conjunctivae normal.     Pupils: Pupils are equal, round, and reactive to light.  Neck:      Musculoskeletal: Normal range of motion and neck supple.  Cardiovascular:     Rate and Rhythm: Normal rate and regular rhythm.     Heart sounds: Normal heart sounds.  Pulmonary:     Effort: Pulmonary effort is normal. No respiratory distress.     Breath sounds: Normal breath sounds.  Abdominal:     General: There is no distension.     Palpations: Abdomen is soft.     Tenderness: There is abdominal tenderness.     Comments: Mild tenderness to the LLQ  Musculoskeletal: Normal range of motion.        General: No deformity.  Skin:    General: Skin is warm and dry.  Neurological:     Mental Status: She is alert and oriented to person, place, and time.      ED Treatments / Results  Labs (all labs ordered are listed, but only abnormal results are displayed) Labs Reviewed - No data to display  EKG None  Radiology No results found.  Procedures Procedures (including critical care time)  Medications Ordered in ED Medications  morphine 4 MG/ML injection 4 mg (has no administration in time range)     Initial Impression / Assessment and Plan / ED Course  I have reviewed the triage vital signs and the nursing notes.  Pertinent labs & imaging results that were available during my care of the patient were reviewed by me and considered in my medical decision making (see chart for details).        MDM  Screen complete  Amanda Dillon was evaluated in Emergency Department on 04/06/2019 for the symptoms described in the history of present illness. She was evaluated in the context of the global COVID-19 pandemic, which necessitated consideration that the patient might be at risk for infection with the SARS-CoV-2 virus that causes COVID-19. Institutional protocols and algorithms that pertain to the evaluation of patients at risk for COVID-19 are in a state of rapid change based on information released by regulatory bodies including the CDC and federal and state organizations. These  policies and algorithms were followed during the patient's care in the ED.   Patient is presenting for breakthrough pain related to her left ovarian cyst.  Following administration of pain medicine in the ED she significant proved.  She now desires discharge home.  Patient was offered additional work-up and investigation while in the ED.  She declined same.  She specifically declined ultrasound to reevaluate her known left ovarian cyst.  She does understand the need for close follow-up with Dr. Denman George tomorrow.  Importance of close follow-up is repeatedly stressed.  Strict return precautions given and  understood.   Final Clinical Impressions(s) / ED Diagnoses   Final diagnoses:  LLQ pain    ED Discharge Orders    None       Valarie Merino, MD 04/06/19 1445

## 2019-04-07 ENCOUNTER — Telehealth: Payer: Self-pay

## 2019-04-07 NOTE — Patient Instructions (Addendum)
DUE TO COVID-19 ONLY ONE VISITOR IS ALLOWED TO COME WITH YOU AND STAY IN THE WAITING ROOM ONLY DURING PRE OP AND PROCEDURE DAY OF SURGERY. THE 1 VISITOR MAY VISIT WITH YOU AFTER SURGERY IN YOUR PRIVATE ROOM DURING VISITING HOURS ONLY!  YOU NEED TO HAVE A COVID 19 TEST ON___11/19____ @__8 :30_____, THIS TEST MUST BE DONE BEFORE SURGERY, COME  [address], [place] , [address].  ([place]) ONCE YOUR COVID TEST IS COMPLETED, PLEASE BEGIN THE QUARANTINE INSTRUCTIONS AS OUTLINED IN YOUR HANDOUT.                Makinzy [place]ingham    Your procedure is scheduled on: 04/14/19   Report to [facility] Main  Entrance   Report to admitting at  7:00 AM     Call this number if you have problems the morning of surgery [phone]    . BRUSH YOUR TEETH MORNING OF SURGERY AND RINSE YOUR MOUTH OUT, NO CHEWING GUM CANDY OR MINTS.     Do not eat food After Midnight.   YOU MAY HAVE CLEAR LIQUIDS FROM MIDNIGHT UNTIL 6:00 AM  . A 6:00 AM Please finish the prescribed Pre-Surgery  drink.   Nothing by mouth after you finish the  drink !    Take these medicines the morning of surgery with A SIP OF WATER: Wellbutrin, Vilazodone                                 You may not have any metal on your body including hair pins and              piercings  Do not wear jewelry, make-up, lotions, powders or perfumes, deodorant             Do not wear nail polish on your fingernails.  Do not shave  48 hours prior to surgery.              Do not bring valuables to the hospital. [place].  Contacts, dentures or bridgework may not be worn into surgery.      Patients discharged the day of surgery will not be allowed to drive home.   IF YOU ARE HAVING SURGERY AND GOING HOME THE SAME DAY, YOU MUST HAVE AN ADULT TO DRIVE YOU HOME AND BE WITH YOU FOR 24 HOURS.   YOU MAY GO HOME BY TAXI OR UBER OR ORTHERWISE, BUT AN ADULT MUST ACCOMPANY YOU  HOME AND STAY WITH YOU FOR 24 HOURS.  Name and phone number of your driver:  Special Instructions: N/A              Please read over the following fact sheets you were given: _____________________________________________________________________             [facility] - Preparing for Surgery  Before surgery, you can play an important role.   Because skin is not sterile, your skin needs to be as free of germs as possible.   You can reduce the number of germs on your skin by washing with CHG (chlorahexidine gluconate) soap before surgery.   CHG is an antiseptic cleaner which kills germs and bonds with the skin to continue killing germs even after washing. Please DO NOT use if you have an allergy to CHG or antibacterial  soaps.   If your skin becomes reddened/irritated stop using the CHG and inform your nurse when you arrive at Short Stay. Do not shave (including legs and underarms) for at least 48 hours prior to the first CHG shower.  . Please follow these instructions carefully:  1.  Shower with CHG Soap the night before surgery and the  morning of Surgery.  2.  If you choose to wash your hair, wash your hair first as usual with your  normal  shampoo.  3.  After you shampoo, rinse your hair and body thoroughly to remove the  shampoo.                                        4.  Use CHG as you would any other liquid soap.  You can apply chg directly  to the skin and wash                       Gently with a scrungie or clean washcloth.  5.  Apply the CHG Soap to your body ONLY FROM THE NECK DOWN.   Do not use on face/ open                           Wound or open sores. Avoid contact with eyes, ears mouth and genitals (private parts).                       Wash face,  Genitals (private parts) with your normal soap.             6.  Wash thoroughly, paying special attention to the area where your surgery  will be performed.  7.  Thoroughly rinse your body with warm water from the neck down.  8.   DO NOT shower/wash with your normal soap after using and rinsing off  the CHG Soap.             9.  Pat yourself dry with a clean towel.            10.  Wear clean pajamas.            11.  Place clean sheets on your bed the night of your first shower and do not  sleep with pets. Day of Surgery : Do not apply any lotions/deodorants the morning of surgery.  Please wear clean clothes to the hospital/surgery center.  FAILURE TO FOLLOW THESE INSTRUCTIONS MAY RESULT IN THE CANCELLATION OF YOUR SURGERY PATIENT SIGNATURE_________________________________  NURSE SIGNATURE__________________________________  ________________________________________________________________________ Full Liquid Diet   Strained creamy soups Tea, Coffee- with cream or mild and sugar or honey  Juices- cranberry , grape and apple  Jello  Milkshakes  Pudding , custards  Popsicles  Water Plain ice cream f, frozen yogurt, sherbet, plain yogurt  Fruit ices and popsicles with no fruit pulp  Sugar, honey and syrups Clear broths  Boost, Ensure, Resource and other liquid supplements NO CARBONATED BEVERAGES

## 2019-04-07 NOTE — Telephone Encounter (Signed)
Amanda Dillon states she went to ED yesterday for severe pain in Left lower abdomen. She was given 10 tablets of  hydrocodone 5/325 for the pain. It is effective for pain control.  She was calling for a prescription to be sent in as the 10 tablets will not be enough to get her to surgery 04-14-19. Reviewed with Joylene John, NP. Concerned that pt will become constipated and have a lot of stool in bowel at surgery. [name] would like for her to use tramadol and begin the senokot-S.  She has not had a good BM in a couple of days.She will use the Senokot-2 2 tabs bid and add Miralax 1 capful bid prn. If she is in severs pain and the tramadol is not effective, she can use the hydrocodone 5-325 mg.  Call tomorrow if she had to take hydrocodone to discuss further pain management. Pt verbalized understanding. Told her that the CA-125 from 04-04-19 was WNL at 23.5 per Joylene John, NP.

## 2019-04-09 ENCOUNTER — Telehealth: Payer: Self-pay

## 2019-04-09 ENCOUNTER — Encounter (HOSPITAL_COMMUNITY)
Admission: RE | Admit: 2019-04-09 | Discharge: 2019-04-09 | Disposition: A | Discharge status: Home / Self Care | Payer: PRIVATE HEALTH INSURANCE | Source: Ambulatory Visit | Attending: Gynecologic Oncology | Admitting: Gynecologic Oncology

## 2019-04-09 ENCOUNTER — Encounter (HOSPITAL_COMMUNITY): Payer: Self-pay

## 2019-04-09 ENCOUNTER — Other Ambulatory Visit: Payer: Self-pay

## 2019-04-09 ENCOUNTER — Other Ambulatory Visit: Payer: Self-pay | Admitting: Gynecologic Oncology

## 2019-04-09 DIAGNOSIS — N83202 Unspecified ovarian cyst, left side: Secondary | ICD-10-CM | POA: Diagnosis present

## 2019-04-09 DIAGNOSIS — Z01812 Encounter for preprocedural laboratory examination: Secondary | ICD-10-CM | POA: Insufficient documentation

## 2019-04-09 DIAGNOSIS — Z79899 Other long term (current) drug therapy: Secondary | ICD-10-CM | POA: Diagnosis not present

## 2019-04-09 DIAGNOSIS — F1021 Alcohol dependence, in remission: Secondary | ICD-10-CM | POA: Diagnosis not present

## 2019-04-09 DIAGNOSIS — F329 Major depressive disorder, single episode, unspecified: Secondary | ICD-10-CM | POA: Diagnosis not present

## 2019-04-09 DIAGNOSIS — K660 Peritoneal adhesions (postprocedural) (postinfection): Secondary | ICD-10-CM | POA: Diagnosis not present

## 2019-04-09 DIAGNOSIS — N83512 Torsion of left ovary and ovarian pedicle: Secondary | ICD-10-CM | POA: Diagnosis not present

## 2019-04-09 DIAGNOSIS — N9489 Other specified conditions associated with female genital organs and menstrual cycle: Secondary | ICD-10-CM

## 2019-04-09 DIAGNOSIS — F419 Anxiety disorder, unspecified: Secondary | ICD-10-CM | POA: Diagnosis not present

## 2019-04-09 LAB — URINALYSIS, ROUTINE W REFLEX MICROSCOPIC
Bilirubin Urine: NEGATIVE
Glucose, UA: NEGATIVE mg/dL
Ketones, ur: 5 mg/dL — AB
Nitrite: NEGATIVE
Protein, ur: 100 mg/dL — AB
RBC / HPF: >50 RBC/hpf — ABNORMAL HIGH (ref 0–5)
Specific Gravity, Urine: 1.024 (ref 1.005–1.030)
WBC, UA: >50 WBC/hpf — ABNORMAL HIGH (ref 0–5)
pH: 5 (ref 5.0–8.0)

## 2019-04-09 LAB — COMPREHENSIVE METABOLIC PANEL
ALT: 16 U/L (ref 0–44)
AST: 17 U/L (ref 15–41)
Albumin: 3.7 g/dL (ref 3.5–5.0)
Alkaline Phosphatase: 59 U/L (ref 38–126)
Anion gap: 9 (ref 5–15)
BUN: 14 mg/dL (ref 6–20)
CO2: 25 mmol/L (ref 22–32)
Calcium: 8.2 mg/dL — ABNORMAL LOW (ref 8.9–10.3)
Chloride: 97 mmol/L — ABNORMAL LOW (ref 98–111)
Creatinine, Ser: 0.89 mg/dL (ref 0.44–1.00)
GFR calc Af Amer: >60 mL/min (ref >60)
GFR calc non Af Amer: >60 mL/min (ref >60)
Glucose, Bld: 189 mg/dL — ABNORMAL HIGH (ref 70–99)
Potassium: 3.5 mmol/L (ref 3.5–5.1)
Sodium: 131 mmol/L — ABNORMAL LOW (ref 135–145)
Total Bilirubin: 0.8 mg/dL (ref 0.3–1.2)
Total Protein: 7.1 g/dL (ref 6.5–8.1)

## 2019-04-09 LAB — PROTIME-INR
INR: 1.1 (ref 0.8–1.2)
Prothrombin Time: 14 seconds (ref 11.4–15.2)

## 2019-04-09 LAB — CBC
HCT: 39 % (ref 36.0–46.0)
Hemoglobin: 12.6 g/dL (ref 12.0–15.0)
MCH: 30.2 pg (ref 26.0–34.0)
MCHC: 32.3 g/dL (ref 30.0–36.0)
MCV: 93.5 fL (ref 80.0–100.0)
Platelets: 211 10*3/uL (ref 150–400)
RBC: 4.17 MIL/uL (ref 3.87–5.11)
RDW: 13.2 % (ref 11.5–15.5)
WBC: 9.5 10*3/uL (ref 4.0–10.5)
nRBC: 0 % (ref 0.0–0.2)

## 2019-04-09 LAB — HEMOGLOBIN A1C
Hgb A1c MFr Bld: 5.2 % (ref 4.8–5.6)
Mean Plasma Glucose: 102.54 mg/dL

## 2019-04-09 LAB — ABO/RH: ABO/RH(D): O POS

## 2019-04-09 LAB — APTT: aPTT: 33 seconds (ref 24–36)

## 2019-04-09 MED ORDER — TRAMADOL HCL 50 MG PO TABS: 50.0000 mg | ORAL_TABLET | Freq: Four times a day (QID) | ORAL | 0 refills | Status: DC | PRN

## 2019-04-09 NOTE — Telephone Encounter (Signed)
I let Amanda Dillon know her A1C is 5.2, WNL. She verbalized understanding.

## 2019-04-09 NOTE — Telephone Encounter (Signed)
Ms Tooker called requestion tramadol refill. Joylene John, NP sent rx to [facility] on [address].  Also I told pt her Na was low and instructed her to add salt to her diet.  I told her the urinalysis shows a possible UTI a c and s was ordered.  Also she states she has never had an A1c drawn.. I relayed all of this to Joylene John, NP.  The pt verbalized understanding

## 2019-04-09 NOTE — Progress Notes (Signed)
See RN note.

## 2019-04-09 NOTE — Progress Notes (Signed)
PCP - Dr. Matilde Haymaker Cardiologist - none  Chest x-ray - no EKG - no Stress Test - no ECHO - no Cardiac Cath - no  Sleep Study - no CPAP -   Fasting Blood Sugar - NA Checks Blood Sugar _____ times a day  Blood Thinner Instructions: NA Aspirin Instructions: Last Dose:  Anesthesia review:   Patient denies shortness of breath, fever, cough and chest pain at PAT appointment yes  Patient verbalized understanding of instructions that were given to them at the PAT appointment. Patient was also instructed that they will need to review over the PAT instructions again at home before surgery. Yes Pt is having her period now so the UA will be blood +.

## 2019-04-10 ENCOUNTER — Other Ambulatory Visit (HOSPITAL_COMMUNITY)
Admission: RE | Admit: 2019-04-10 | Discharge: 2019-04-10 | Disposition: A | Discharge status: Home / Self Care | Payer: PRIVATE HEALTH INSURANCE | Source: Ambulatory Visit | Attending: Gynecologic Oncology | Admitting: Gynecologic Oncology

## 2019-04-10 DIAGNOSIS — Z20828 Contact with and (suspected) exposure to other viral communicable diseases: Secondary | ICD-10-CM | POA: Diagnosis not present

## 2019-04-10 DIAGNOSIS — Z01812 Encounter for preprocedural laboratory examination: Secondary | ICD-10-CM | POA: Insufficient documentation

## 2019-04-11 ENCOUNTER — Telehealth: Payer: Self-pay

## 2019-04-11 LAB — NOVEL CORONAVIRUS, NAA (HOSP ORDER, SEND-OUT TO REF LAB; TAT 18-24 HRS): SARS-CoV-2, NAA: NOT DETECTED

## 2019-04-11 NOTE — Anesthesia Preprocedure Evaluation (Addendum)
Anesthesia Evaluation  Patient identified by MRN, date of birth, ID band Patient awake    Reviewed: Allergy & Precautions, NPO status , Patient's Chart, lab work & pertinent test results  History of Anesthesia Complications Negative for: history of anesthetic complications  Airway Mallampati: II  TM Distance: >3 FB Neck ROM: Full    Dental no notable dental hx. (+) Dental Advisory Given   Pulmonary neg pulmonary ROS,    Pulmonary exam normal        Cardiovascular negative cardio ROS Normal cardiovascular exam     Neuro/Psych PSYCHIATRIC DISORDERS Anxiety Depression negative neurological ROS     GI/Hepatic negative GI ROS, Neg liver ROS,   Endo/Other  negative endocrine ROS  Renal/GU negative Renal ROS     Musculoskeletal negative musculoskeletal ROS (+)   Abdominal   Peds  Hematology negative hematology ROS (+)   Anesthesia Other Findings Day of surgery medications reviewed with the patient.  Reproductive/Obstetrics                            Anesthesia Physical Anesthesia Plan  ASA: II  Anesthesia Plan: General   Post-op Pain Management:    Induction: Intravenous  PONV Risk Score and Plan: 4 or greater and Ondansetron, Dexamethasone, Scopolamine patch - Pre-op and Midazolam  Airway Management Planned: Oral ETT  Additional Equipment:   Intra-op Plan:   Post-operative Plan: Extubation in OR  Informed Consent: I have reviewed the patients History and Physical, chart, labs and discussed the procedure including the risks, benefits and alternatives for the proposed anesthesia with the patient or authorized representative who has indicated his/her understanding and acceptance.     Dental advisory given  Plan Discussed with: CRNA and Anesthesiologist  Anesthesia Plan Comments:        Anesthesia Quick Evaluation

## 2019-04-11 NOTE — Telephone Encounter (Signed)
Amanda Dillon asked if she could take the tramadol the morning of her surgery? She also wanted to know if her urine culture is back? Pt has an ensure drink given to take pre op. Reviewed with Joylene John, NP Told her not to take any tramadol after midnight as she will be having anaesthesia. The urine culture is not back yet.  [name] stated that Dr. Denman George was not going to give her antibiotic prior to surgery after reviewing the pre op urinalysis. She can take the Ensure drink as instructed by pre-op.

## 2019-04-13 LAB — URINE CULTURE: Culture: >=100000 — AB

## 2019-04-14 ENCOUNTER — Encounter (HOSPITAL_COMMUNITY): Payer: Self-pay | Admitting: General Practice

## 2019-04-14 ENCOUNTER — Ambulatory Visit (HOSPITAL_COMMUNITY)
Admission: RE | Admit: 2019-04-14 | Discharge: 2019-04-14 | Disposition: A | Discharge status: Home / Self Care | Payer: PRIVATE HEALTH INSURANCE | Attending: Gynecologic Oncology | Admitting: Gynecologic Oncology

## 2019-04-14 ENCOUNTER — Ambulatory Visit (HOSPITAL_COMMUNITY): Payer: PRIVATE HEALTH INSURANCE | Admitting: Physician Assistant

## 2019-04-14 ENCOUNTER — Ambulatory Visit (HOSPITAL_COMMUNITY): Payer: PRIVATE HEALTH INSURANCE | Admitting: Anesthesiology

## 2019-04-14 ENCOUNTER — Encounter (HOSPITAL_COMMUNITY)
Admission: RE | Disposition: A | Discharge status: Home / Self Care | Payer: Self-pay | Source: Home / Self Care | Attending: Gynecologic Oncology

## 2019-04-14 DIAGNOSIS — K660 Peritoneal adhesions (postprocedural) (postinfection): Secondary | ICD-10-CM | POA: Diagnosis not present

## 2019-04-14 DIAGNOSIS — N83512 Torsion of left ovary and ovarian pedicle: Secondary | ICD-10-CM | POA: Insufficient documentation

## 2019-04-14 DIAGNOSIS — N83202 Unspecified ovarian cyst, left side: Secondary | ICD-10-CM

## 2019-04-14 DIAGNOSIS — F1021 Alcohol dependence, in remission: Secondary | ICD-10-CM | POA: Insufficient documentation

## 2019-04-14 DIAGNOSIS — F329 Major depressive disorder, single episode, unspecified: Secondary | ICD-10-CM | POA: Insufficient documentation

## 2019-04-14 DIAGNOSIS — F419 Anxiety disorder, unspecified: Secondary | ICD-10-CM | POA: Insufficient documentation

## 2019-04-14 DIAGNOSIS — Z79899 Other long term (current) drug therapy: Secondary | ICD-10-CM | POA: Insufficient documentation

## 2019-04-14 DIAGNOSIS — N9489 Other specified conditions associated with female genital organs and menstrual cycle: Secondary | ICD-10-CM

## 2019-04-14 HISTORY — PX: ROBOTIC ASSISTED SALPINGO OOPHERECTOMY: SHX6082

## 2019-04-14 LAB — PREGNANCY, URINE: Preg Test, Ur: NEGATIVE

## 2019-04-14 LAB — TYPE AND SCREEN
ABO/RH(D): O POS
Antibody Screen: NEGATIVE

## 2019-04-14 SURGERY — SALPINGO-OOPHORECTOMY, ROBOT-ASSISTED
Anesthesia: General

## 2019-04-14 MED ORDER — CELECOXIB 200 MG PO CAPS
400.0000 mg | ORAL_CAPSULE | ORAL | Status: AC
  Administered 2019-04-14: 400 mg via ORAL
  Filled 2019-04-14: qty 2

## 2019-04-14 MED ORDER — ACETAMINOPHEN 500 MG PO TABS: 1000.0000 mg | ORAL_TABLET | Freq: Once | ORAL | Status: DC

## 2019-04-14 MED ORDER — PHENYLEPHRINE 40 MCG/ML (10ML) SYRINGE FOR IV PUSH (FOR BLOOD PRESSURE SUPPORT)
PREFILLED_SYRINGE | INTRAVENOUS | Status: AC
  Filled 2019-04-14: qty 10

## 2019-04-14 MED ORDER — MIDAZOLAM HCL 2 MG/2ML IJ SOLN
INTRAMUSCULAR | Status: DC | PRN
  Administered 2019-04-14: 2 mg via INTRAVENOUS

## 2019-04-14 MED ORDER — BUPIVACAINE HCL (PF) 0.25 % IJ SOLN
INTRAMUSCULAR | Status: DC | PRN
  Administered 2019-04-14: 17 mL

## 2019-04-14 MED ORDER — SUGAMMADEX SODIUM 500 MG/5ML IV SOLN
INTRAVENOUS | Status: AC
  Filled 2019-04-14: qty 10

## 2019-04-14 MED ORDER — GABAPENTIN 300 MG PO CAPS
300.0000 mg | ORAL_CAPSULE | ORAL | Status: AC
  Administered 2019-04-14: 300 mg via ORAL
  Filled 2019-04-14: qty 1

## 2019-04-14 MED ORDER — KETAMINE HCL 10 MG/ML IJ SOLN
INTRAMUSCULAR | Status: AC
  Filled 2019-04-14: qty 1

## 2019-04-14 MED ORDER — ACETAMINOPHEN 500 MG PO TABS
1000.0000 mg | ORAL_TABLET | ORAL | Status: AC
  Administered 2019-04-14: 1000 mg via ORAL
  Filled 2019-04-14: qty 2

## 2019-04-14 MED ORDER — LACTATED RINGERS IV SOLN
INTRAVENOUS | Status: DC
  Administered 2019-04-14 (×2): via INTRAVENOUS

## 2019-04-14 MED ORDER — LIDOCAINE 2% (20 MG/ML) 5 ML SYRINGE
INTRAMUSCULAR | Status: DC | PRN
  Administered 2019-04-14: 1.5 mg/kg/h via INTRAVENOUS

## 2019-04-14 MED ORDER — LACTATED RINGERS IR SOLN
Status: DC | PRN
  Administered 2019-04-14: 1000 mL

## 2019-04-14 MED ORDER — LIDOCAINE 2% (20 MG/ML) 5 ML SYRINGE
INTRAMUSCULAR | Status: AC
  Filled 2019-04-14: qty 5

## 2019-04-14 MED ORDER — SUGAMMADEX SODIUM 200 MG/2ML IV SOLN
INTRAVENOUS | Status: DC | PRN
  Administered 2019-04-14: 200 mg via INTRAVENOUS

## 2019-04-14 MED ORDER — ONDANSETRON HCL 4 MG/2ML IJ SOLN
INTRAMUSCULAR | Status: AC
  Filled 2019-04-14: qty 2

## 2019-04-14 MED ORDER — AMOXICILLIN 500 MG PO TABS: 500.0000 mg | ORAL_TABLET | Freq: Two times a day (BID) | ORAL | 0 refills | Status: DC

## 2019-04-14 MED ORDER — KETAMINE HCL 10 MG/ML IJ SOLN
INTRAMUSCULAR | Status: DC | PRN
  Administered 2019-04-14: 50 mg via INTRAVENOUS

## 2019-04-14 MED ORDER — FENTANYL CITRATE (PF) 100 MCG/2ML IJ SOLN
25.0000 ug | INTRAMUSCULAR | Status: DC | PRN
  Administered 2019-04-14 (×2): 50 ug via INTRAVENOUS

## 2019-04-14 MED ORDER — FENTANYL CITRATE (PF) 250 MCG/5ML IJ SOLN
INTRAMUSCULAR | Status: AC
  Filled 2019-04-14: qty 5

## 2019-04-14 MED ORDER — ROCURONIUM BROMIDE 10 MG/ML (PF) SYRINGE
PREFILLED_SYRINGE | INTRAVENOUS | Status: AC
  Filled 2019-04-14: qty 10

## 2019-04-14 MED ORDER — DEXAMETHASONE SODIUM PHOSPHATE 10 MG/ML IJ SOLN
INTRAMUSCULAR | Status: DC | PRN
  Administered 2019-04-14: 10 mg via INTRAVENOUS

## 2019-04-14 MED ORDER — EPHEDRINE SULFATE-NACL 50-0.9 MG/10ML-% IV SOSY
PREFILLED_SYRINGE | INTRAVENOUS | Status: DC | PRN
  Administered 2019-04-14 (×2): 10 mg via INTRAVENOUS

## 2019-04-14 MED ORDER — KETOROLAC TROMETHAMINE 30 MG/ML IJ SOLN: 30.0000 mg | Freq: Once | INTRAMUSCULAR | Status: DC

## 2019-04-14 MED ORDER — ROCURONIUM BROMIDE 10 MG/ML (PF) SYRINGE
PREFILLED_SYRINGE | INTRAVENOUS | Status: DC | PRN
  Administered 2019-04-14: 60 mg via INTRAVENOUS

## 2019-04-14 MED ORDER — BUPIVACAINE HCL (PF) 0.25 % IJ SOLN
INTRAMUSCULAR | Status: AC
  Filled 2019-04-14: qty 30

## 2019-04-14 MED ORDER — DEXAMETHASONE SODIUM PHOSPHATE 10 MG/ML IJ SOLN
INTRAMUSCULAR | Status: AC
  Filled 2019-04-14: qty 1

## 2019-04-14 MED ORDER — LIDOCAINE 2% (20 MG/ML) 5 ML SYRINGE
INTRAMUSCULAR | Status: DC | PRN
  Administered 2019-04-14: 100 mg via INTRAVENOUS

## 2019-04-14 MED ORDER — DEXAMETHASONE SODIUM PHOSPHATE 4 MG/ML IJ SOLN: 4.0000 mg | INTRAMUSCULAR | Status: DC

## 2019-04-14 MED ORDER — MIDAZOLAM HCL 2 MG/2ML IJ SOLN
INTRAMUSCULAR | Status: AC
  Filled 2019-04-14: qty 2

## 2019-04-14 MED ORDER — PROPOFOL 10 MG/ML IV BOLUS
INTRAVENOUS | Status: AC
  Filled 2019-04-14: qty 20

## 2019-04-14 MED ORDER — CEFAZOLIN SODIUM-DEXTROSE 2-4 GM/100ML-% IV SOLN
2.0000 g | INTRAVENOUS | Status: AC
  Administered 2019-04-14: 09:00:00 2 g via INTRAVENOUS
  Filled 2019-04-14: qty 100

## 2019-04-14 MED ORDER — PROMETHAZINE HCL 25 MG/ML IJ SOLN: 6.2500 mg | INTRAMUSCULAR | Status: DC | PRN

## 2019-04-14 MED ORDER — TRAMADOL HCL 50 MG PO TABS: 50.0000 mg | ORAL_TABLET | Freq: Four times a day (QID) | ORAL | 0 refills | Status: DC | PRN

## 2019-04-14 MED ORDER — FENTANYL CITRATE (PF) 100 MCG/2ML IJ SOLN
INTRAMUSCULAR | Status: AC
  Filled 2019-04-14: qty 2

## 2019-04-14 MED ORDER — PHENYLEPHRINE 40 MCG/ML (10ML) SYRINGE FOR IV PUSH (FOR BLOOD PRESSURE SUPPORT)
PREFILLED_SYRINGE | INTRAVENOUS | Status: DC | PRN
  Administered 2019-04-14 (×4): 80 ug via INTRAVENOUS

## 2019-04-14 MED ORDER — ONDANSETRON HCL 4 MG/2ML IJ SOLN
INTRAMUSCULAR | Status: DC | PRN
  Administered 2019-04-14: 4 mg via INTRAVENOUS

## 2019-04-14 MED ORDER — SCOPOLAMINE 1 MG/3DAYS TD PT72
1.0000 | MEDICATED_PATCH | TRANSDERMAL | Status: DC
  Administered 2019-04-14: 1.5 mg via TRANSDERMAL
  Filled 2019-04-14: qty 1

## 2019-04-14 MED ORDER — CELECOXIB 200 MG PO CAPS: 400.0000 mg | ORAL_CAPSULE | Freq: Once | ORAL | Status: DC

## 2019-04-14 MED ORDER — SCOPOLAMINE 1 MG/3DAYS TD PT72: 1.0000 | MEDICATED_PATCH | TRANSDERMAL | Status: DC

## 2019-04-14 MED ORDER — FENTANYL CITRATE (PF) 250 MCG/5ML IJ SOLN
INTRAMUSCULAR | Status: DC | PRN
  Administered 2019-04-14: 100 ug via INTRAVENOUS

## 2019-04-14 MED ORDER — EPHEDRINE 5 MG/ML INJ
INTRAVENOUS | Status: AC
  Filled 2019-04-14: qty 10

## 2019-04-14 MED ORDER — PROPOFOL 10 MG/ML IV BOLUS
INTRAVENOUS | Status: DC | PRN
  Administered 2019-04-14: 130 mg via INTRAVENOUS
  Administered 2019-04-14: 60 mg via INTRAVENOUS

## 2019-04-14 MED ORDER — LIDOCAINE HCL 2 % IJ SOLN
INTRAMUSCULAR | Status: AC
  Filled 2019-04-14: qty 20

## 2019-04-14 MED ORDER — SUCCINYLCHOLINE CHLORIDE 200 MG/10ML IV SOSY
PREFILLED_SYRINGE | INTRAVENOUS | Status: AC
  Filled 2019-04-14: qty 10

## 2019-04-14 MED ORDER — STERILE WATER FOR IRRIGATION IR SOLN
Status: DC | PRN
  Administered 2019-04-14: 1000 mL

## 2019-04-14 SURGICAL SUPPLY — 67 items
ADH SKN CLS APL DERMABOND .7 (GAUZE/BANDAGES/DRESSINGS) ×2
AGENT HMST KT MTR STRL THRMB (HEMOSTASIS)
APL ESCP 34 STRL LF DISP (HEMOSTASIS)
APPLICATOR SURGIFLO ENDO (HEMOSTASIS) IMPLANT
BAG LAPAROSCOPIC 12 15 PORT 16 (BASKET) IMPLANT
BAG RETRIEVAL 12/15 (BASKET) ×3
BAG SPEC RTRVL LRG 6X4 10 (ENDOMECHANICALS)
BLADE SURG SZ10 CARB STEEL (BLADE) IMPLANT
COVER BACK TABLE 60X90IN (DRAPES) ×3 IMPLANT
COVER TIP SHEARS 8 DVNC (MISCELLANEOUS) ×2 IMPLANT
COVER TIP SHEARS 8MM DA VINCI (MISCELLANEOUS) ×1
COVER WAND RF STERILE (DRAPES) IMPLANT
DECANTER SPIKE VIAL GLASS SM (MISCELLANEOUS) IMPLANT
DERMABOND ADVANCED (GAUZE/BANDAGES/DRESSINGS) ×1
DERMABOND ADVANCED .7 DNX12 (GAUZE/BANDAGES/DRESSINGS) ×2 IMPLANT
DRAPE ARM DVNC X/XI (DISPOSABLE) ×8 IMPLANT
DRAPE COLUMN DVNC XI (DISPOSABLE) ×2 IMPLANT
DRAPE DA VINCI XI ARM (DISPOSABLE) ×4
DRAPE DA VINCI XI COLUMN (DISPOSABLE) ×1
DRAPE SHEET LG 3/4 BI-LAMINATE (DRAPES) ×3 IMPLANT
DRAPE SURG IRRIG POUCH 19X23 (DRAPES) ×3 IMPLANT
DRSG OPSITE POSTOP 4X6 (GAUZE/BANDAGES/DRESSINGS) IMPLANT
DRSG OPSITE POSTOP 4X8 (GAUZE/BANDAGES/DRESSINGS) IMPLANT
ELECT REM PT RETURN 15FT ADLT (MISCELLANEOUS) ×3 IMPLANT
GLOVE BIO SURGEON STRL SZ 6 (GLOVE) ×12 IMPLANT
GLOVE BIO SURGEON STRL SZ 6.5 (GLOVE) ×2 IMPLANT
GOWN STRL REUS W/ TWL LRG LVL3 (GOWN DISPOSABLE) ×8 IMPLANT
GOWN STRL REUS W/TWL LRG LVL3 (GOWN DISPOSABLE) ×12
HOLDER FOLEY CATH W/STRAP (MISCELLANEOUS) ×3 IMPLANT
IRRIG SUCT STRYKERFLOW 2 WTIP (MISCELLANEOUS) ×3
IRRIGATION SUCT STRKRFLW 2 WTP (MISCELLANEOUS) ×2 IMPLANT
KIT PROCEDURE DA VINCI SI (MISCELLANEOUS)
KIT PROCEDURE DVNC SI (MISCELLANEOUS) IMPLANT
KIT TURNOVER KIT A (KITS) IMPLANT
MANIPULATOR UTERINE 4.5 ZUMI (MISCELLANEOUS) ×3 IMPLANT
NDL SPNL 18GX3.5 QUINCKE PK (NEEDLE) IMPLANT
NEEDLE HYPO 22GX1.5 SAFETY (NEEDLE) IMPLANT
NEEDLE SPNL 18GX3.5 QUINCKE PK (NEEDLE) IMPLANT
OBTURATOR OPTICAL STANDARD 8MM (TROCAR) ×1
OBTURATOR OPTICAL STND 8 DVNC (TROCAR) ×2
OBTURATOR OPTICALSTD 8 DVNC (TROCAR) ×2 IMPLANT
PACK ROBOT GYN CUSTOM WL (TRAY / TRAY PROCEDURE) ×3 IMPLANT
PAD POSITIONING PINK XL (MISCELLANEOUS) ×3 IMPLANT
PENCIL SMOKE EVACUATOR (MISCELLANEOUS) IMPLANT
PORT ACCESS TROCAR AIRSEAL 12 (TROCAR) ×2 IMPLANT
PORT ACCESS TROCAR AIRSEAL 5M (TROCAR) ×1
POUCH SPECIMEN RETRIEVAL 10MM (ENDOMECHANICALS) IMPLANT
SEAL CANN UNIV 5-8 DVNC XI (MISCELLANEOUS) ×6 IMPLANT
SEAL XI 5MM-8MM UNIVERSAL (MISCELLANEOUS) ×3
SET TRI-LUMEN FLTR TB AIRSEAL (TUBING) ×3 IMPLANT
SPONGE LAP 18X18 X RAY DECT (DISPOSABLE) IMPLANT
SURGIFLO W/THROMBIN 8M KIT (HEMOSTASIS) IMPLANT
SUT MNCRL AB 4-0 PS2 18 (SUTURE) IMPLANT
SUT PDS AB 1 TP1 96 (SUTURE) IMPLANT
SUT VIC AB 0 CT1 27 (SUTURE)
SUT VIC AB 0 CT1 27XBRD ANTBC (SUTURE) IMPLANT
SUT VIC AB 2-0 CT1 27 (SUTURE)
SUT VIC AB 2-0 CT1 TAPERPNT 27 (SUTURE) IMPLANT
SUT VICRYL 4-0 PS2 18IN ABS (SUTURE) ×6 IMPLANT
SYR 10ML LL (SYRINGE) IMPLANT
TOWEL OR NON WOVEN STRL DISP B (DISPOSABLE) ×3 IMPLANT
TRAP SPECIMEN MUCOUS 40CC (MISCELLANEOUS) ×1 IMPLANT
TRAY FOLEY MTR SLVR 16FR STAT (SET/KITS/TRAYS/PACK) ×3 IMPLANT
TROCAR XCEL NON-BLD 5MMX100MML (ENDOMECHANICALS) IMPLANT
UNDERPAD 30X36 HEAVY ABSORB (UNDERPADS AND DIAPERS) ×3 IMPLANT
WATER STERILE IRR 1000ML POUR (IV SOLUTION) ×3 IMPLANT
YANKAUER SUCT BULB TIP 10FT TU (MISCELLANEOUS) ×1 IMPLANT

## 2019-04-14 NOTE — Anesthesia Procedure Notes (Signed)
Procedure Name: Intubation Date/Time: 04/14/2019 9:22 AM Performed by: Sharlette Dense, CRNA Patient Re-evaluated:Patient Re-evaluated prior to induction Oxygen Delivery Method: Circle system utilized Preoxygenation: Pre-oxygenation with 100% oxygen Induction Type: IV induction Ventilation: Mask ventilation without difficulty and Oral airway inserted - appropriate to patient size Laryngoscope Size: Mac and 3 Grade View: Grade I Tube type: Oral Tube size: 7.5 mm Number of attempts: 1 Airway Equipment and Method: Stylet Placement Confirmation: ETT inserted through vocal cords under direct vision,  positive ETCO2 and breath sounds checked- equal and bilateral Secured at: 21 cm Tube secured with: Tape Dental Injury: Teeth and Oropharynx as per pre-operative assessment  Comments: Intubation performed by Danae Chen, EMT student

## 2019-04-14 NOTE — Transfer of Care (Signed)
Immediate Anesthesia Transfer of Care Note  Patient: Amanda Dillon  Procedure(s) Performed: XI ROBOTIC ASSISTED SALPINGO OOPHORECTOMY, LYSIS OF ADHESIONS (Left )  Patient Location: PACU  Anesthesia Type:General  Level of Consciousness: awake and oriented  Airway & Oxygen Therapy: Patient Spontanous Breathing and Patient connected to face mask oxygen  Post-op Assessment: Report given to RN and Post -op Vital signs reviewed and stable  Post vital signs: Reviewed and stable  Last Vitals:  Vitals Value Taken Time  BP    Temp    Pulse 70 04/14/19 1112  Resp    SpO2 100 % 04/14/19 1112  Vitals shown include unvalidated device data.  Last Pain:  Vitals:   04/14/19 0708  TempSrc: Oral  PainSc: 4          Complications: No apparent anesthesia complications

## 2019-04-14 NOTE — Discharge Instructions (Addendum)
04/14/2019  Return to work: 4 weeks  Activity: 1. Be up and out of the bed during the day.  Take a nap if needed.  You may walk up steps but be careful and use the hand rail.  Stair climbing will tire you more than you think, you may need to stop part way and rest.   2. No lifting or straining for 4 weeks.  3. No driving for 1 weeks.  Do Not drive if you are taking narcotic pain medicine.  4. Shower daily.  Use soap and water on your incision and pat dry; don't rub.   5. No sexual activity and nothing in the vagina for 2 weeks.  6. You will need to take amoxicillin for one week for a urinary tract infection.  Medications:  - Take ibuprofen and tylenol (start with tylenol) first line for pain control. Take these regularly (every 6 hours) to decrease the build up of pain.  - If necessary, for severe pain not relieved by ibuprofen, take tramadol.  - While taking tramadol you should take sennakot every night to reduce the likelihood of constipation. If this causes diarrhea, stop its use.  Diet: 1. Low sodium Heart Healthy Diet is recommended.  2. It is safe to use a laxative if you have difficulty moving your bowels.   Wound Care: 1. Keep clean and dry.  Shower daily. Glue will fall off on its own.  Reasons to call the Doctor:   Fever - Oral temperature greater than 100.4 degrees Fahrenheit  Foul-smelling vaginal discharge  Difficulty urinating  Nausea and vomiting  Increased pain at the site of the incision that is unrelieved with pain medicine.  Difficulty breathing with or without chest pain  New calf pain especially if only on one side  Sudden, continuing increased vaginal bleeding with or without clots.   Follow-up: 1. See Everitt Amber in 4 weeks.  Contacts: For questions or concerns you should contact:  Dr. Everitt Amber at [phone] After hours and on week-ends call [phone] and ask to speak to the physician on call for Gynecologic Oncology   General  Anesthesia, Adult, Care After This sheet gives you information about how to care for yourself after your procedure. Your health care provider may also give you more specific instructions. If you have problems or questions, contact your health care provider. What can I expect after the procedure? After the procedure, the following side effects are common:  Pain or discomfort at the IV site.  Nausea.  Vomiting.  Sore throat.  Trouble concentrating.  Feeling cold or chills.  Weak or tired.  Sleepiness and fatigue.  Soreness and body aches. These side effects can affect parts of the body that were not involved in surgery. Follow these instructions at home:  For at least 24 hours after the procedure:  Have a responsible adult stay with you. It is important to have someone help care for you until you are awake and alert.  Rest as needed.  Do not: ? Participate in activities in which you could fall or become injured. ? Drive. ? Use heavy machinery. ? Drink alcohol. ? Take sleeping pills or medicines that cause drowsiness. ? Make important decisions or sign legal documents. ? Take care of children on your own. Eating and drinking  Follow any instructions from your health care provider about eating or drinking restrictions.  When you feel hungry, start by eating small amounts of foods that are soft and easy to digest (  bland), such as toast. Gradually return to your regular diet.  Drink enough fluid to keep your urine pale yellow.  If you vomit, rehydrate by drinking water, juice, or clear broth. General instructions  If you have sleep apnea, surgery and certain medicines can increase your risk for breathing problems. Follow instructions from your health care provider about wearing your sleep device: ? Anytime you are sleeping, including during daytime naps. ? While taking prescription pain medicines, sleeping medicines, or medicines that make you drowsy.  Return to your  normal activities as told by your health care provider. Ask your health care provider what activities are safe for you.  Take over-the-counter and prescription medicines only as told by your health care provider.  If you smoke, do not smoke without supervision.  Keep all follow-up visits as told by your health care provider. This is important. Contact a health care provider if:  You have nausea or vomiting that does not get better with medicine.  You cannot eat or drink without vomiting.  You have pain that does not get better with medicine.  You are unable to pass urine.  You develop a skin rash.  You have a fever.  You have redness around your IV site that gets worse. Get help right away if:  You have difficulty breathing.  You have chest pain.  You have blood in your urine or stool, or you vomit blood. Summary  After the procedure, it is common to have a sore throat or nausea. It is also common to feel tired.  Have a responsible adult stay with you for the first 24 hours after general anesthesia. It is important to have someone help care for you until you are awake and alert.  When you feel hungry, start by eating small amounts of foods that are soft and easy to digest (bland), such as toast. Gradually return to your regular diet.  Drink enough fluid to keep your urine pale yellow.  Return to your normal activities as told by your health care provider. Ask your health care provider what activities are safe for you. This information is not intended to replace advice given to you by your health care provider. Make sure you discuss any questions you have with your health care provider. Document Released: 08/14/2000 Document Revised: 05/11/2017 Document Reviewed: 12/22/2016 Elsevier Patient Education  2020 [employer].

## 2019-04-14 NOTE — Anesthesia Postprocedure Evaluation (Signed)
Anesthesia Post Note  Patient: Amanda Dillon  Procedure(s) Performed: XI ROBOTIC ASSISTED SALPINGO OOPHORECTOMY, LYSIS OF ADHESIONS (Left )     Patient location during evaluation: PACU Anesthesia Type: General Level of consciousness: sedated Pain management: pain level controlled Vital Signs Assessment: post-procedure vital signs reviewed and stable Respiratory status: spontaneous breathing and respiratory function stable Cardiovascular status: stable Postop Assessment: no apparent nausea or vomiting Anesthetic complications: no    Last Vitals:  Vitals:   04/14/19 1309 04/14/19 1345  BP: 116/70 111/69  Pulse: 74 81  Resp:    Temp: 36.9 C   SpO2: 94% 94%    Last Pain:  Vitals:   04/14/19 1345  TempSrc:   PainSc: 0-No pain                 [name]

## 2019-04-14 NOTE — Op Note (Signed)
OPERATIVE NOTE  Date: 04/14/19  Preoperative Diagnosis: left ovarian cystic mass   Postoperative Diagnosis:  Left ovarian cyst, left ovarian torsion, omental adhesions  Procedure(s) Performed: Robotic-assisted laparoscopic left salpingo-oophorectomy, lysis of adhsions  Surgeon: Everitt Amber, M.D.  Assistant Surgeon: Joylene John, NP. (a provider assistant was necessary for tissue manipulation, management of robotic instrumentation, retraction and positioning due to the complexity of the case and hospital policies).   Anesthesia: Gen. endotracheal.  Specimens: left tube and ovary, pelvic washings  Estimated Blood Loss: <20 mL. Blood Replacement: None  Complications: none  Indication for Procedure:  11cm left ovarian cystic mass, omental adhesions.  Operative Findings: 11cm left ovarian cystic mass torsed at least 2 spins on its ovarian pedicle, with dense adhesions but between the omentum and the mass secondary to inflammatory changes.  The mass was also adherent with adhesions from inflammation to the posterior uterus and left pelvic sidewall.  Normal right tube and ovary normal uterus.  Normal upper abdomen.  Frozen pathology was consistent with benign hemorrhagic cyst  Procedure: The patient's taken to the operating room and placed under general endotracheal anesthesia testing difficulty. She is placed in a dorsolithotomy position and cervical acromial pad was placed. The arms were tucked with care taken to pad the olecranon process. And prepped and draped in usual sterile fashion. A uterine manipulator (zumi) was placed vaginally. A 90mm incision was made in the left upper quadrant palmer's point and a 5 mm Optiview trocar used to enter the abdomen under direct visualization. With entry into the abdomen and then maintenance of 15 mm of mercury the patient was placed in Trendelenburg position. An incision was made in the umbilicus and a [id number] trochar was placed through this site. Two  incisions were made lateral to the umbilical incision in the left and right abdomen measuring 35mm. These incisions were made approximately 10 cm lateral to the umbilical incision. 8 mm robotic trochars were inserted. A left lateral incision was placed in the flank under direct visualization. The robot was docked.  The abdomen was inspected as was the pelvis.  Pelvic washings were obtained.  For 15 minutes sharp adhesiolysis was performed to separate the omental he adhesions from the left ovarian mass.  Due to the torsion of the mass it had spontaneously ruptured old blood clot and cystic fluid.  This was aspirated.  After the inflammatory adhesions between the omentum and the mass and the mass in the pelvic cul-de-sac and anterior cul-de-sac were separated nephrectomy could proceed.  An incision was made on the left pelvic side wall peritoneum parallel to the IP ligament and the retroperitoneal space entered. The left ureter was identified and the para-rectal space was developed. A window was created in the left broad ligament above the ureter. The left infundibulopelvic vessels were skeletonized cauterized and transected. The utero-ovarian ligaments similarly were cauterized and transected. Specimen was placed in an Endo Catch bag.  Hemostasis was observed.  This included inspection of the omentum.  When all the blood clot had been aspirated the robotic portion of the procedure was completed.  The robot was undocked. The contents of the left Endo Catch bag were first aspirated and then morcellated within the bag to facilitate removal from the abdominal cavity through the Left uper quadrant incision.  The ports were all removed. The fascial closure at the umbilical incision and left upper quadrant port was made with 0 Vicryl.  All incisions were closed with a running subcuticular Monocryl suture. Dermabond was applied.  Sponge, lap and needle counts were correct x 3.    The patient had sequential compression  devices for VTE prophylaxis.         Disposition: PACU -stable         Condition: stable  Donaciano Eva, MD

## 2019-04-14 NOTE — Interval H&P Note (Signed)
History and Physical Interval Note:  04/14/2019 7:27 AM  Amanda Dillon  has presented today for surgery, with the diagnosis of LEFT OVARIAN CYST.  The various methods of treatment have been discussed with the patient and family. After consideration of risks, benefits and other options for treatment, the patient has consented to  Procedure(s): XI ROBOTIC ASSISTED SALPINGO OOPHORECTOMY (Left) POSSIBLE XI ROBOTIC ASSISTED TOTAL HYSTERECTOMY WITH POSSIBLE STAGING (N/A) as a surgical intervention.  The patient's history has been reviewed, patient examined, no change in status, stable for surgery.  I have reviewed the patient's chart and labs.  Questions were answered to the patient's satisfaction.     Thereasa Solo

## 2019-04-15 ENCOUNTER — Telehealth: Payer: Self-pay

## 2019-04-15 ENCOUNTER — Other Ambulatory Visit: Payer: PRIVATE HEALTH INSURANCE

## 2019-04-15 ENCOUNTER — Encounter (HOSPITAL_COMMUNITY): Payer: Self-pay | Admitting: Gynecologic Oncology

## 2019-04-15 LAB — SURGICAL PATHOLOGY

## 2019-04-15 LAB — CYTOLOGY - NON PAP

## 2019-04-15 NOTE — Telephone Encounter (Signed)
Amanda Dillon is doing well this am.   Her abdomen is sore. She is using the ibuprofen and tylenol with good effect. Has not needed the tramadol. Incisions are D&I. She is eating, drinking, and urinating well. She has not passed gas bu did begin senokot-s and Miralax. Told her she could increase the senokot-s to bid as well ad the miralax if needed. She is experiencing slight vaginal spotting . Told her that this is normal from the vaginal manipulator used during surgery. She is due to begin her period next week. Told her that is fine.  It may not start as expected due to the surgery.                       Pt aware of her post op appointment on 05-08-19. She also has the office number [phone] if she has any questions or concerns.

## 2019-04-16 ENCOUNTER — Telehealth: Payer: Self-pay

## 2019-04-16 NOTE — Telephone Encounter (Signed)
Told Amanda Dillon that her surgical pathology shows no cancer per Joylene John, NP.

## 2019-04-23 ENCOUNTER — Other Ambulatory Visit: Payer: Self-pay

## 2019-04-23 ENCOUNTER — Ambulatory Visit (INDEPENDENT_AMBULATORY_CARE_PROVIDER_SITE_OTHER): Payer: No Typology Code available for payment source | Admitting: Psychiatry

## 2019-04-23 DIAGNOSIS — Z8659 Personal history of other mental and behavioral disorders: Secondary | ICD-10-CM | POA: Diagnosis not present

## 2019-04-23 DIAGNOSIS — F4323 Adjustment disorder with mixed anxiety and depressed mood: Secondary | ICD-10-CM

## 2019-04-23 DIAGNOSIS — N83202 Unspecified ovarian cyst, left side: Secondary | ICD-10-CM

## 2019-04-23 DIAGNOSIS — F325 Major depressive disorder, single episode, in full remission: Secondary | ICD-10-CM
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
## 2019-04-29 ENCOUNTER — Ambulatory Visit: Payer: No Typology Code available for payment source | Admitting: Physician Assistant

## 2019-05-08 ENCOUNTER — Other Ambulatory Visit: Payer: Self-pay

## 2019-05-08 ENCOUNTER — Encounter: Payer: Self-pay | Admitting: Gynecologic Oncology

## 2019-05-08 ENCOUNTER — Inpatient Hospital Stay: Payer: PRIVATE HEALTH INSURANCE | Attending: Gynecologic Oncology | Admitting: Gynecologic Oncology

## 2019-05-08 VITALS — BP 140/95 | HR 85 | Temp 98.5°F | Resp 20 | Ht 66.0 in | Wt 199.5 lb

## 2019-05-08 DIAGNOSIS — Z90721 Acquired absence of ovaries, unilateral: Secondary | ICD-10-CM

## 2019-05-08 DIAGNOSIS — F1021 Alcohol dependence, in remission: Secondary | ICD-10-CM | POA: Diagnosis not present

## 2019-05-08 DIAGNOSIS — N83292 Other ovarian cyst, left side: Secondary | ICD-10-CM | POA: Insufficient documentation

## 2019-05-08 DIAGNOSIS — N83202 Unspecified ovarian cyst, left side: Secondary | ICD-10-CM

## 2019-05-08 NOTE — Patient Instructions (Signed)
Dr Denman George is clearing you for all activities after December 23rd and to return to work January 4th.  Dr Denman George recommends following up with Dr Nori Riis as planned in the new year.  The ovarian cyst was a hemorrhagic cyst with no cancer.   If you have any concerns about the surgery or your healing please contact Dr Denman George at [phone].

## 2019-05-08 NOTE — Progress Notes (Signed)
Follow-up Note: Gyn-Onc  Consult was requested by Dr. Nori Riis for the evaluation of Amanda Dillon 53 y.o. female  CC:  Chief Complaint  Patient presents with  . Left ovarian cyst  . Post-op Follow-up    Assessment/Plan:  Ms. Amanda Dillon  is a 53 y.o.  year old who is 3 weeks s/p left salpingo-oophorectomy for a torsed left benign ovarian hemorrhagic cyst.  ` We discussed the benign nature of her ovarian cyst.  Likable patient she is at risk for development of a cyst on the contralateral ovary but no specific follow-up is needed to monitor for this.  She will follow-up for routine gynecologic care with Dr. Nori Riis.  We appreciate the opportunity to take part in this patient's care.  HPI: Ms. Amanda Dillon is a very pleasant 53 year old P0 who is seen in consultation at the request of Dr. Nori Riis. She reported feeling symptoms of pelvic pain, particularly on the left in October 2020.  This prompted her to have a CT scan of the abdomen and pelvis performed on March 21, 2019 which revealed a large solitary gallstone and a uterus that was unremarkable, no right adnexal abnormality, or bilobed cystic lesion measuring 10.3 x 8.1 cm seen in the left adnexal region.   MRI on November 9th, 2020 showed a complex multiseptated cyst within the left adnexa with ill-defined enhancement along the posterior medial margin favored to represent ovarian tissue.  There is also relatively simple appearing at largest cystic mass in the anterior pelvis.  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  The patient has never been pregnant.  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  She lives alone.  She has a remote history of alcoholism but has been sober for 10 years prior to this diagnosis.  She suffers from no sequelae from her history of alcohol abuse.  She has a prior surgical history significant for a laparoscopy performed by Dr. Nori Riis remotely for what she remembers being told  was adenomyosis.  This was 20 years ago.  She denies hearing that it was endometriosis.  Interval Hx:   48 hours before surgery she developed acute abdominal pain and was seen in the ER.  On 04/14/19 she underwent robotic assisted LSO with lysis of adhesions. Intraoperative findings were significant for an 11 cm torsed left ovarian cyst that was benign on frozen section.  It was somewhat hemorrhagic to the omentum and ovarian fossa due to its torsion and inflammatory process.. Surgery was uncomplicated.  Final pathology revealed benign hemorrhagic cyst of the left ovary.  Since surgery well with no specific complaints.   Current Meds:  Outpatient Encounter Medications as of 05/08/2019  Medication Sig  . buPROPion (WELLBUTRIN XL) 300 MG 24 hr tablet Take 300 mg by mouth daily.  Marland Kitchen ibuprofen (ADVIL) 800 MG tablet Take 1 tablet (800 mg total) by mouth every 8 (eight) hours as needed for moderate pain. FOR AFTER SURGERY  . prazosin (MINIPRESS) 1 MG capsule Take 1 mg by mouth at bedtime.  . senna-docusate (SENOKOT-S) 8.6-50 MG tablet Take 2 tablets by mouth at bedtime. FOR AFTER SURGERY, DO NOT TAKE IF HAVING DIARRHEA (Patient taking differently: Take 2 tablets by mouth at bedtime as needed. )  . Vilazodone HCl (VIIBRYD) 40 MG TABS Take 1 tablet (40 mg total) by mouth daily.  . [DISCONTINUED] amoxicillin (AMOXIL) 500 MG tablet Take 1 tablet (500 mg total) by mouth 2 (two) times daily.  . [DISCONTINUED] traMADol (  ULTRAM) 50 MG tablet Take 1 tablet (50 mg total) by mouth every 6 (six) hours as needed for severe pain. DO NOT TAKE AND DRIVE   No facility-administered encounter medications on file as of 05/08/2019.    Allergy: No Known Allergies  Social Hx:   Social History   Socioeconomic History  . Marital status: Single    Spouse name: Not on file  . Number of children: Not on file  . Years of education: Not on file  . Highest education level: Not on file  Occupational History  . Not on  file  Tobacco Use  . Smoking status: Never Smoker  . Smokeless tobacco: Never Used  Substance and Sexual Activity  . Alcohol use: Not Currently    Comment: "I'm an alcoholic but sober for 10 years."  . Drug use: Never  . Sexual activity: Not on file  Other Topics Concern  . Not on file  Social History Narrative  . Not on file   Social Determinants of Health   Financial Resource Strain:   . Difficulty of Paying Living Expenses: Not on file  Food Insecurity:   . Worried About Charity fundraiser in the Last Year: Not on file  . Ran Out of Food in the Last Year: Not on file  Transportation Needs:   . Lack of Transportation (Medical): Not on file  . Lack of Transportation (Non-Medical): Not on file  Physical Activity:   . Days of Exercise per Week: Not on file  . Minutes of Exercise per Session: Not on file  Stress:   . Feeling of Stress : Not on file  Social Connections:   . Frequency of Communication with Friends and Family: Not on file  . Frequency of Social Gatherings with Friends and Family: Not on file  . Attends Religious Services: Not on file  . Active Member of Clubs or Organizations: Not on file  . Attends Archivist Meetings: Not on file  . Marital Status: Not on file  Intimate Partner Violence:   . Fear of Current or Ex-Partner: Not on file  . Emotionally Abused: Not on file  . Physically Abused: Not on file  . Sexually Abused: Not on file    Past Surgical Hx:  Past Surgical History:  Procedure Laterality Date  . LAPAROSCOPY N/A 05/23/2003  . ROBOTIC ASSISTED SALPINGO OOPHERECTOMY Left 04/14/2019   Procedure: XI ROBOTIC ASSISTED SALPINGO OOPHORECTOMY, LYSIS OF ADHESIONS;  Surgeon: Everitt Amber, MD;  Location: [facility];  Service: Gynecology;  Laterality: Left;    Past Medical Hx:  Past Medical History:  Diagnosis Date  . Adenomyosis 05/23/2003  . Depression     Past Gynecological History:  See HPi Patient's last menstrual period was 04/09/2019  (exact date).  Family Hx: History reviewed. No pertinent family history.  Review of Systems:  Constitutional  Feels well,    ENT Normal appearing ears and nares bilaterally Skin/Breast  No rash, sores, jaundice, itching, dryness Cardiovascular  No chest pain, shortness of breath, or edema  Pulmonary  No cough or wheeze.  Gastro Intestinal  No nausea, vomitting, or diarrhoea. No bright red blood per rectum, no abdominal pain, change in bowel movement, or constipation.  Genito Urinary  No frequency, urgency, dysuria, + left pelvic pain Musculo Skeletal  No myalgia, arthralgia, joint swelling or pain  Neurologic  No weakness, numbness, change in gait,  Psychology  No depression, anxiety, insomnia.   Vitals:  Blood pressure (!) 140/95, pulse 85, temperature  98.5 F (36.9 C), temperature source Temporal, resp. rate 20, height 5\' 6"  (1.676 m), weight 199 lb 8 oz (90.5 kg), last menstrual period 04/09/2019, SpO2 96 %.  Physical Exam: WD in NAD Neck  Supple NROM, without any enlargements.  Lymph Node Survey No cervical supraclavicular or inguinal adenopathy Cardiovascular  Pulse normal rate, regularity and rhythm. S1 and S2 normal.  Lungs  Clear to auscultation bilateraly, without wheezes/crackles/rhonchi. Good air movement.  Skin  No rash/lesions/breakdown  Psychiatry  Alert and oriented to person, place, and time  Abdomen  Well healed incisions.   Thereasa Solo, MD  05/08/2019, 5:05 PM

## 2019-05-26 ENCOUNTER — Other Ambulatory Visit: Payer: Self-pay | Admitting: Physician Assistant

## 2019-05-27 ENCOUNTER — Other Ambulatory Visit: Payer: Self-pay | Admitting: Physician Assistant

## 2019-05-27 NOTE — Telephone Encounter (Signed)
Patient has not been seen since June 2020, still taking?

## 2019-05-30 NOTE — Telephone Encounter (Signed)
Pt confirmed that she is still taking Buproprion 150mg  daily. Please send in RX.

## 2019-06-16 ENCOUNTER — Ambulatory Visit (INDEPENDENT_AMBULATORY_CARE_PROVIDER_SITE_OTHER): Payer: No Typology Code available for payment source | Admitting: Physician Assistant

## 2019-06-16 ENCOUNTER — Other Ambulatory Visit: Payer: Self-pay

## 2019-06-16 ENCOUNTER — Encounter: Payer: Self-pay | Admitting: Physician Assistant

## 2019-06-16 VITALS — BP 150/98 | HR 70

## 2019-06-16 DIAGNOSIS — R030 Elevated blood-pressure reading, without diagnosis of hypertension: Secondary | ICD-10-CM | POA: Diagnosis not present

## 2019-06-16 DIAGNOSIS — F4310 Post-traumatic stress disorder, unspecified: Secondary | ICD-10-CM | POA: Diagnosis not present

## 2019-06-16 DIAGNOSIS — F325 Major depressive disorder, single episode, in full remission: Secondary | ICD-10-CM | POA: Diagnosis not present

## 2019-06-16 DIAGNOSIS — F1021 Alcohol dependence, in remission: Secondary | ICD-10-CM | POA: Diagnosis not present

## 2019-06-16 MED ORDER — VIIBRYD 40 MG PO TABS: 40.0000 mg | ORAL_TABLET | Freq: Every day | ORAL | 1 refills | Status: DC

## 2019-06-16 MED ORDER — PRAZOSIN HCL 1 MG PO CAPS: 1.0000 mg | ORAL_CAPSULE | Freq: Every day | ORAL | 1 refills | Status: DC

## 2019-06-16 MED ORDER — BUPROPION HCL ER (XL) 150 MG PO TB24: 150.0000 mg | ORAL_TABLET | Freq: Every day | ORAL | 1 refills | Status: DC

## 2019-06-16 NOTE — Progress Notes (Signed)
[facility]  Patient ID: Amanda Dillon,  MRN: [id number]  PCP: Donald Prose, MD  Date of Evaluation: 06/16/2019 Time spent:20 minutes  Chief Complaint:  Chief Complaint    Follow-up      HISTORY/CURRENT STATUS: HPI for 57-month med check.  Patient states she is doing well.  Cannot remember the last time she was depressed.  She is able to enjoy things.  Energy and motivation are good.  She does not cry easily.  Denies suicidal or homicidal thoughts.  States the prazosin is not causing any side effects such as dizziness or feeling faint.  Reports having known whitecoat hypertension.  Denies night terrors.  Sleeps well most of the time.  Asks about possible increase of depression since she had an oophorectomy in the fall.  She has not had any increased depression since then.  She still has her right ovary.  She will make an appointment for routine GYN exam in March or thereabouts.  She will ask them as well.  Denies dizziness, syncope, seizures, numbness, tingling, tremor, tics, unsteady gait, slurred speech, confusion. Denies muscle or joint pain, stiffness, or dystonia.  Individual Medical History/ Review of Systems: Changes? :Yes  had left oopherectomy 04/14/2019.   Past medications for mental health diagnoses include: Uncertain.  She has taken a higher dose of Wellbutrin XL which did help but she has not needed that strength for a while.  Allergies: Patient has no known allergies.  Current Medications:  Current Outpatient Medications:  .  buPROPion (WELLBUTRIN XL) 150 MG 24 hr tablet, Take 1 tablet (150 mg total) by mouth daily., Disp: 90 tablet, Rfl: 1 .  prazosin (MINIPRESS) 1 MG capsule, Take 1 capsule (1 mg total) by mouth at bedtime., Disp: 90 capsule, Rfl: 1 .  Vilazodone HCl (VIIBRYD) 40 MG TABS, Take 1 tablet (40 mg total) by mouth daily., Disp: 90 tablet, Rfl: 1 .  ibuprofen (ADVIL) 800 MG tablet, Take 1 tablet (800 mg total) by mouth every 8 (eight) hours  as needed for moderate pain. FOR AFTER SURGERY (Patient not taking: Reported on 06/16/2019), Disp: 30 tablet, Rfl: 0 .  senna-docusate (SENOKOT-S) 8.6-50 MG tablet, Take 2 tablets by mouth at bedtime. FOR AFTER SURGERY, DO NOT TAKE IF HAVING DIARRHEA (Patient not taking: Reported on 06/16/2019), Disp: 60 tablet, Rfl: 1 Medication Side Effects: none  Family Medical/ Social History: Changes? No  MENTAL HEALTH EXAM:  Blood pressure (!) 150/98, pulse 70.There is no height or weight on file to calculate BMI.  General Appearance: Casual, Neat, Well Groomed and Obese  Eye Contact:  Good  Speech:  Clear and Coherent  Volume:  Normal  Mood:  Euthymic  Affect:  Appropriate  Thought Process:  Goal Directed and Descriptions of Associations: Intact  Orientation:  Full (Time, Place, and Person)  Thought Content: Logical   Suicidal Thoughts:  No  Homicidal Thoughts:  No  Memory:  WNL  Judgement:  Good  Insight:  Good  Psychomotor Activity:  Normal  Concentration:  Concentration: Good  Recall:  Good  Fund of Knowledge: Good  Language: Good  Assets:  Desire for Improvement  ADL's:  Intact  Cognition: WNL  Prognosis:  Good    DIAGNOSES:    ICD-10-CM   1. Major depressive disorder in full remission, unspecified whether recurrent ([place])  F32.5   2. PTSD (post-traumatic stress disorder)  F43.10   3. Alcohol dependence in remission ([place])  F10.21   4. White coat syndrome without hypertension  R03.0  Receiving Psychotherapy: Yes  With Dr. Jonni Sanger Mitchum   RECOMMENDATIONS: I spent 20 minutes with her. I am glad to see she is doing so well! Continue Viibryd 40 mg daily. Continue prazosin 1 mg nightly.  If nightmares recur, she can call and we will increase the dose over the phone. Continue Wellbutrin XL 150 mg daily.  If she should notice worsening of depression, call and I will increase the dose.  Since she still has her right ovary, I do not feel like the surgical procedure itself, causing  decreased estrogen, will create problems with her mood, as long as her right ovary is functioning properly. Continue therapy with Dr. Luan Moore. Return in 6 months.  Donnal Moat, PA-C

## 2019-10-08 ENCOUNTER — Telehealth: Payer: Self-pay | Admitting: Psychiatry

## 2019-10-08 NOTE — Telephone Encounter (Signed)
Reviewed

## 2019-10-08 NOTE — Telephone Encounter (Signed)
There is no particular interactions around loratadine.  Some people are more sensitive to the side effects of this particular antihistamine than they are to others.  Obviously all antihistamines can cause some tiredness.

## 2019-10-08 NOTE — Telephone Encounter (Signed)
Pt has a question about medication interaction. She took Loratidine today ( 1 tab) and she is feeling extremely tired. Can this be interacting with her usual meds?

## 2019-11-05 ENCOUNTER — Other Ambulatory Visit: Payer: Self-pay

## 2019-11-05 ENCOUNTER — Ambulatory Visit (INDEPENDENT_AMBULATORY_CARE_PROVIDER_SITE_OTHER): Payer: No Typology Code available for payment source | Admitting: Psychiatry

## 2019-11-05 DIAGNOSIS — F331 Major depressive disorder, recurrent, moderate: Secondary | ICD-10-CM | POA: Diagnosis not present

## 2019-11-05 DIAGNOSIS — Z8659 Personal history of other mental and behavioral disorders: Secondary | ICD-10-CM

## 2019-11-05 DIAGNOSIS — F411 Generalized anxiety disorder: Secondary | ICD-10-CM | POA: Diagnosis not present

## 2019-11-05 DIAGNOSIS — Z90721 Acquired absence of ovaries, unilateral: Secondary | ICD-10-CM | POA: Diagnosis not present

## 2019-11-05 DIAGNOSIS — F1021 Alcohol dependence, in remission: Secondary | ICD-10-CM

## 2019-11-05 DIAGNOSIS — Z724 Inappropriate diet and eating habits: Secondary | ICD-10-CM

## 2019-11-05 NOTE — Progress Notes (Signed)
Psychotherapy Progress Note [facility] Luan Moore, PhD LP  Patient ID: Amanda Dillon     MRN: [id number] Therapy format: Individual psychotherapy Date: 11/05/2019      Start: 5:33p     Stop: 6:23p     Time Spent: 50 min Location: In-person   Session narrative (presenting needs, interim history, self-report of stressors and symptoms, applications of prior therapy, status changes, and interventions made in session) Had left ovary removed for noncancerous cysts, complications with adhesions.  Right ovary now showing multiple cysts, also understood to be noncancerous.  Worries whether she's getting depressed, but has been able to control thoughts.  More stuck, can't exercise for pain, so missing endorphins and turning to ice cream for comfort, has gained 60lbs.  Stomach hurts every day, has some uncertainty about what to make of it, has to fight off worry thoughts.  Able to keep working, and remains abstinent from alcohol about 10 years now, but relapsing in compulsive overeating to cope with anxiety, worry, and loss of exercise benefit.  Discussed tactics for reducing cravings and addressing weight gain.  PT always has a large soda with her, and large amounts of ice cream were an addictive habit before and after she became alcohol-abstinent.  Discussed possible psychiatric med change, e.g, trade Wellbutrin for SNRI to help with pain, and if indicated, reduce anxiety.  Recognizes pandemic isolated her, too, and her support system is not what it was.  Heavily concerned for friend [name], who is a fellow patient for some time now, and with whom we have continuing agreement to consult when concerned for each other.  Discussed outlook for gynecological surgery, resolved to inform her gynecologist further about quality of life concerns and need for further treatment.  Therapeutic modalities: Cognitive Behavioral Therapy, Solution-Oriented/Positive Psychology, Ego-Supportive and  Assertiveness/Communication  Mental Status/Observations:  Appearance:   Casual     Behavior:  Appropriate  Motor:  Normal  Speech/Language:   Clear and Coherent  Affect:  Appropriate  Mood:  anxious and depressed  Thought process:  normal  Thought content:    WNL and Obsessions  Sensory/Perceptual disturbances:    WNL  Orientation:  Fully oriented  Attention:  Good    Concentration:  Good  Memory:  WNL  Insight:    Good  Judgment:   Fair  Impulse Control:  Fair   Risk Assessment: Danger to Self: No Self-injurious Behavior: No Danger to Others: No Physical Aggression / Violence: No Duty to Warn: No Access to Firearms a concern: No  Assessment of progress:  situational setback(s)  Diagnosis:   ICD-10-CM   1. Major depressive disorder, recurrent episode, moderate (HCC)  F33.1   2. Generalized anxiety disorder  F41.1   3. History of dysthymia  Z86.59   4. History of posttraumatic stress disorder (PTSD)  Z86.59   5. S/P left oophorectomy  Z90.721    may need right  6. Eating problem  Z72.4   7. Alcohol dependence in remission ([place])  F10.21    Plan:  . Look into carb control methods, including use of MCT oil if feeling low energy and carb cravings . Self-affirm it need not be fate for her to relapse fully in depression and food addiction, but it will take doing something differently . Recommend reduce sodas, even diet . Specific advice re. support for friend with TRD . Other recommendations/advice as may be noted above . Continue to utilize previously learned skills ad lib . Maintain medication as prescribed and  work faithfully with relevant prescriber(s) if any changes are desired or seem indicated . Call the clinic on-call service, present to ER, or call 911 if any life-threatening psychiatric crisis Return for time at discretion. . Already scheduled visit in this office 12/15/2019.  [name], PhD Luan Moore, PhD LP Clinical Psychologist, [facility]  Group [facility] [address], [place], [place] [address] [phone]

## 2019-12-15 ENCOUNTER — Encounter: Payer: Self-pay | Admitting: Physician Assistant

## 2019-12-15 ENCOUNTER — Other Ambulatory Visit: Payer: Self-pay

## 2019-12-15 ENCOUNTER — Ambulatory Visit (INDEPENDENT_AMBULATORY_CARE_PROVIDER_SITE_OTHER): Payer: No Typology Code available for payment source | Admitting: Physician Assistant

## 2019-12-15 VITALS — BP 154/97 | HR 79

## 2019-12-15 DIAGNOSIS — F411 Generalized anxiety disorder: Secondary | ICD-10-CM

## 2019-12-15 DIAGNOSIS — R030 Elevated blood-pressure reading, without diagnosis of hypertension: Secondary | ICD-10-CM | POA: Diagnosis not present

## 2019-12-15 DIAGNOSIS — F4310 Post-traumatic stress disorder, unspecified: Secondary | ICD-10-CM | POA: Diagnosis not present

## 2019-12-15 DIAGNOSIS — F331 Major depressive disorder, recurrent, moderate: Secondary | ICD-10-CM

## 2019-12-15 MED ORDER — BUPROPION HCL ER (XL) 300 MG PO TB24: 300.0000 mg | ORAL_TABLET | Freq: Every day | ORAL | 1 refills | Status: DC

## 2019-12-15 MED ORDER — VIIBRYD 40 MG PO TABS: 40.0000 mg | ORAL_TABLET | Freq: Every day | ORAL | 1 refills | Status: DC

## 2019-12-15 MED ORDER — PRAZOSIN HCL 1 MG PO CAPS: 1.0000 mg | ORAL_CAPSULE | Freq: Every day | ORAL | 1 refills | Status: DC

## 2019-12-15 NOTE — Progress Notes (Signed)
[facility]  Patient ID: Amanda Dillon,  MRN: [id number]  PCP: Donald Prose, MD  Date of Evaluation: 12/15/2019 Time spent:30 minutes  Chief Complaint:  Chief Complaint    Follow-up      HISTORY/CURRENT STATUS: HPI for 7-month med check.  Not feeling as well as she has in the past.  Is more sad and has low energy and motivation.  Not affecting her at work. 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 No dizziness or feeling faint d/t the Prazosin. No suicidal or homicidal thoughts.  Denies anxiety, as least most of the time. Doesn't c/o panic attacks, no SOB, palpitations, sweats, etc.  Patient denies increased energy with decreased need for sleep, no increased talkativeness, no racing thoughts, no impulsivity or risky behaviors, no increased spending, no increased libido, no grandiosity, no increased irritability or anger, and no hallucinations.  Denies dizziness, syncope, seizures, numbness, tingling, tremor, tics, unsteady gait, slurred speech, confusion. Denies muscle or joint pain, stiffness, or dystonia.  Individual Medical History/ Review of Systems: Changes? :Yes  Has a cyst on her right ovary, will have surgery on August 9th.  Past medications for mental health diagnoses include: Uncertain.  She has taken a higher dose of Wellbutrin XL which did help but she has not needed that strength for a while.  Allergies: Patient has no known allergies.  Current Medications:  Current Outpatient Medications:  .  prazosin (MINIPRESS) 1 MG capsule, Take 1 capsule (1 mg total) by mouth at bedtime., Disp: 90 capsule, Rfl: 1 .  Vilazodone HCl (VIIBRYD) 40 MG TABS, Take 1 tablet (40 mg total) by mouth daily., Disp: 90 tablet, Rfl: 1 .  buPROPion (WELLBUTRIN XL) 300 MG 24 hr tablet, Take 1 tablet (300 mg total) by mouth daily., Disp: 90 tablet, Rfl: 1 .   ibuprofen (ADVIL) 800 MG tablet, Take 1 tablet (800 mg total) by mouth every 8 (eight) hours as needed for moderate pain. FOR AFTER SURGERY (Patient not taking: Reported on 06/16/2019), Disp: 30 tablet, Rfl: 0 .  senna-docusate (SENOKOT-S) 8.6-50 MG tablet, Take 2 tablets by mouth at bedtime. FOR AFTER SURGERY, DO NOT TAKE IF HAVING DIARRHEA (Patient not taking: Reported on 06/16/2019), Disp: 60 tablet, Rfl: 1 Medication Side Effects: none  Family Medical/ Social History: Changes? No  MENTAL HEALTH EXAM:  Blood pressure (!) 154/97, pulse 79.There is no height or weight on file to calculate BMI.  General Appearance: Casual, Neat, Well Groomed and Obese  Eye Contact:  Good  Speech:  Clear and Coherent and Normal Rate  Volume:  Normal  Mood:  Euthymic  Affect:  Appropriate  Thought Process:  Goal Directed and Descriptions of Associations: Intact  Orientation:  Full (Time, Place, and Person)  Thought Content: Logical   Suicidal Thoughts:  No  Homicidal Thoughts:  No  Memory:  WNL  Judgement:  Good  Insight:  Good  Psychomotor Activity:  Normal  Concentration:  Concentration: Good and Attention Span: Good  Recall:  Good  Fund of Knowledge: Good  Language: Good  Assets:  Desire for Improvement  ADL's:  Intact  Cognition: WNL  Prognosis:  Good    DIAGNOSES:    ICD-10-CM   1. Major depressive disorder, recurrent episode, moderate (HCC)  F33.1   2. Generalized anxiety disorder  F41.1   3. White coat syndrome without hypertension  R03.0  Receiving Psychotherapy: Yes  With Dr. Luan Moore   RECOMMENDATIONS: PDMP reviewed. I provided 30 mins of face to face time during this encounter. Discussed the depression.  I recommend increasing the Wellbutrin. Pt understands out goal of helping with energy and motivation and getting her out of the melancholy mood. Increase Wellbutrin XL to 300 mg qd. Continue Viibryd 40 mg 1 qd. Continue Prazosin 1 mg po qhs.  Continue therapy with Dr.  Luan Moore. Return in 6 weeks.  Donnal Moat, PA-C

## 2019-12-24 ENCOUNTER — Ambulatory Visit (INDEPENDENT_AMBULATORY_CARE_PROVIDER_SITE_OTHER): Payer: No Typology Code available for payment source | Admitting: Psychiatry

## 2019-12-24 ENCOUNTER — Other Ambulatory Visit: Payer: Self-pay

## 2019-12-24 DIAGNOSIS — F331 Major depressive disorder, recurrent, moderate: Secondary | ICD-10-CM | POA: Diagnosis not present

## 2019-12-24 DIAGNOSIS — F411 Generalized anxiety disorder: Secondary | ICD-10-CM | POA: Diagnosis not present

## 2019-12-24 DIAGNOSIS — F1011 Alcohol abuse, in remission: Secondary | ICD-10-CM

## 2019-12-24 DIAGNOSIS — F1520 Other stimulant dependence, uncomplicated: Secondary | ICD-10-CM

## 2019-12-24 DIAGNOSIS — Z90721 Acquired absence of ovaries, unilateral: Secondary | ICD-10-CM

## 2019-12-24 DIAGNOSIS — Z8659 Personal history of other mental and behavioral disorders: Secondary | ICD-10-CM

## 2019-12-24 DIAGNOSIS — F5089 Other specified eating disorder: Secondary | ICD-10-CM

## 2019-12-24 NOTE — Progress Notes (Signed)
Psychotherapy Progress Note [facility] Luan Moore, PhD LP  Patient ID: Amanda Dillon     MRN: [id number] Therapy format: Individual psychotherapy Date: 12/24/2019      Start: 6:14p     Stop: 7:10p     Time Spent: 46 min Location: In-person   Session narrative (presenting needs, interim history, self-report of stressors and symptoms, applications of prior therapy, status changes, and interventions made in session) Just out of physician's appt, has made decision to take the other ovary.  Just can't abide the continued pain and discomfort.  Did as planned and discussed quality of life in addition to the medical quandary whether it's eventually cancer that was prompting the whole wait-and-see approach.  Badly misses being able to run and see people, has gained substantial weight.    Got increased Wellbutrin the other day with [name] due to demotivation and feeling herself slide toward depression.  Some fright in seeing COVID admissions of people who are vaccinated.  Had to put down her beloved dog, [name], for cancer.  Was very sad doing so.  Admits feeling alienated from God these days as well.  Discussed hopes and expectations of surgery, of God, of her body.  Encouraged for each, affirmed dog's role and value in her life and connections to surviving dogs, friend Education administrator (fellow pt).  Focal fear of relapsing in depression.  Noted resemblances while affirming differences made, lessons taken, supports in place, improvements in how she handles stress.  Cautioned re ongoing sugar habit, that the large sodas she leans on so much do power weight and can have deleterious effects on liver function and perpetuate stress hormones that may be involved in both her emotional downturn and her physical problems.  Agrees to try to reduce.  Therapeutic modalities: Cognitive Behavioral Therapy and Solution-Oriented/Positive Psychology  Mental Status/Observations:  Appearance:   Casual      Behavior:  Appropriate  Motor:  Normal  Speech/Language:   Clear and Coherent  Affect:  Appropriate  Mood:  anxious and depressed  Thought process:  normal  Thought content:    Obsessions  Sensory/Perceptual disturbances:    pain, hearing deficit  Orientation:  Fully oriented  Attention:  Good    Concentration:  Good  Memory:  WNL  Insight:    Good  Judgment:   Fair  Impulse Control:  Fair   Risk Assessment: Danger to Self: No Self-injurious Behavior: No Danger to Others: No Physical Aggression / Violence: No Duty to Warn: No Access to Firearms a concern: No  Assessment of progress:  situational setback(s)  Diagnosis:   ICD-10-CM   1. Major depressive disorder, recurrent episode, moderate (HCC)  F33.1   2. Generalized anxiety disorder  F41.1   3. S/P left oophorectomy  Z90.721   4. 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 Other disorder of eating  F50.89   6. History of dysthymia  Z86.59   7. History of posttraumatic stress disorder (PTSD)  Z86.59   8. History of alcohol abuse  F10.11    Plan:  . Self-affirm self-care in deciding to go through with surgery.  Ask relevant questions of medicine re hormone regulation and aftercare. . Reduce sodas . Option to commemorate [place] in [place], [place], etc. as led to do . Maintain social supports . Other recommendations/advice as may be noted above . Continue to utilize previously learned skills ad lib . Maintain medication as prescribed and work faithfully with relevant prescriber(s) if any changes are desired  or seem indicated . Call the clinic on-call service, present to ER, or call 911 if any life-threatening psychiatric crisis Return for time as available. . Already scheduled visit in this office 01/28/2020.  [name], PhD Luan Moore, PhD LP Clinical Psychologist, [facility] [address] [place], [place] [address] [phone]

## 2020-01-07 ENCOUNTER — Ambulatory Visit: Payer: No Typology Code available for payment source | Admitting: Psychiatry

## 2020-01-28 ENCOUNTER — Ambulatory Visit (INDEPENDENT_AMBULATORY_CARE_PROVIDER_SITE_OTHER): Payer: No Typology Code available for payment source | Admitting: Psychiatry

## 2020-01-28 ENCOUNTER — Other Ambulatory Visit: Payer: Self-pay

## 2020-01-28 DIAGNOSIS — F1520 Other stimulant dependence, uncomplicated: Secondary | ICD-10-CM

## 2020-01-28 DIAGNOSIS — F5089 Other specified eating disorder: Secondary | ICD-10-CM | POA: Diagnosis not present

## 2020-01-28 DIAGNOSIS — Z8659 Personal history of other mental and behavioral disorders: Secondary | ICD-10-CM

## 2020-01-28 DIAGNOSIS — F4010 Social phobia, unspecified: Secondary | ICD-10-CM | POA: Diagnosis not present

## 2020-01-28 DIAGNOSIS — F1021 Alcohol dependence, in remission: Secondary | ICD-10-CM

## 2020-01-28 DIAGNOSIS — F331 Major depressive disorder, recurrent, moderate: Secondary | ICD-10-CM | POA: Diagnosis not present

## 2020-01-28 NOTE — Progress Notes (Signed)
Psychotherapy Progress Note [facility] Luan Moore, PhD LP  Patient ID: Amanda Dillon     MRN: [id number] Therapy format: Individual psychotherapy Date: 01/28/2020      Start: 5:07p     Stop: 5:56p     Time Spent: 49 min Location: In-person   Session narrative (presenting needs, interim history, self-report of stressors and symptoms, applications of prior therapy, status changes, and interventions made in session) Surgery went well, no cancer.  RTW 2 wks out.  Did start HRT.  Saw family this w/e for sister's 50th birthday.  Felt unexpectedly uncomfortable, figures it had to do with her weight gain.  Reverted to "wallflower" ways.  Alcohol present, not tempted, just inconvenient for feeling different.  Slept separately, turned in early.  Feels like it was a setback, some risk of relapsing in low self-esteem, depression, hiding herself, feeling alien, misfit.  Discussed at length, reframing how going there at a time when she has regained weight, is out of her running habit, and is recovering from surgery is inherently a harder challenge than previous visits.  Of course, mother did tell her at departure that Weight [employer]chers is effective, which burst some bubble for feeling accepted, not judged.  Challenged partial projections about this, reviewed what has worked for her, encouraged her to reduce sodas and stick with not bringing ice cream home.  Resolved to start walking this week.  Not up to running again yet.  Continues to attend AA weekly, but not in any other recovery group.  No mention of sponsor or sponsoring.  Continues to see friend [name] (fellow pt) actively but less so while [place] home with respiratory illness.  Therapeutic modalities: Cognitive Behavioral Therapy and Solution-Oriented/Positive Psychology  Mental Status/Observations:  Appearance:   Casual     Behavior:  Appropriate  Motor:  Normal  Speech/Language:   Clear and Coherent  Affect:  Appropriate  Mood:   anxious and dysthymic  Thought process:  normal and worrisome  Thought content:    WNL  Sensory/Perceptual disturbances:    WNL exc hearing loss  Orientation:  Fully oriented  Attention:  Good    Concentration:  Fair  Memory:  WNL  Insight:    Good  Judgment:   Good  Impulse Control:  Fair   Risk Assessment: Danger to Self: No Self-injurious Behavior: No Danger to Others: No Physical Aggression / Violence: No Duty to Warn: No Access to Firearms a concern: No  Assessment of progress:  situational setback(s)  Diagnosis:   ICD-10-CM   1. Social anxiety disorder  F40.10   2. Major depressive disorder, recurrent episode, moderate (HCC)  F33.1   3. 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 Other disorder of eating  F50.89   5. Alcohol dependence in remission ([place])  F10.21   6. History of posttraumatic stress disorder (PTSD)  Z86.59   7. History of dysthymia  Z86.59    Plan:  . Reduce caffeine, prevent buying ice cream . Resume walking, progress to running when able . Other recommendations/advice as may be noted above . Continue to utilize previously learned skills ad lib . Maintain medication as prescribed and work faithfully with relevant prescriber(s) if any changes are desired or seem indicated . Call the clinic on-call service, present to ER, or call 911 if any life-threatening psychiatric crisis Return in about 2 weeks (around 02/11/2020) for session(s) already scheduled. . Already scheduled visit in this office 01/30/2020.  [name], PhD Luan Moore, PhD LP  Clinical Psychologist, [facility] [address], [place], [place] [address] [phone]

## 2020-01-30 ENCOUNTER — Ambulatory Visit: Payer: No Typology Code available for payment source | Admitting: Physician Assistant

## 2020-02-11 ENCOUNTER — Ambulatory Visit (INDEPENDENT_AMBULATORY_CARE_PROVIDER_SITE_OTHER): Payer: No Typology Code available for payment source | Admitting: Psychiatry

## 2020-02-11 ENCOUNTER — Other Ambulatory Visit: Payer: Self-pay

## 2020-02-11 DIAGNOSIS — F331 Major depressive disorder, recurrent, moderate: Secondary | ICD-10-CM | POA: Diagnosis not present

## 2020-02-11 DIAGNOSIS — F1021 Alcohol dependence, in remission: Secondary | ICD-10-CM

## 2020-02-11 DIAGNOSIS — F4010 Social phobia, unspecified: Secondary | ICD-10-CM

## 2020-02-11 DIAGNOSIS — Z8659 Personal history of other mental and behavioral disorders: Secondary | ICD-10-CM

## 2020-02-11 DIAGNOSIS — Z90721 Acquired absence of ovaries, unilateral: Secondary | ICD-10-CM

## 2020-02-11 DIAGNOSIS — F5089 Other specified eating disorder: Secondary | ICD-10-CM | POA: Diagnosis not present

## 2020-02-11 DIAGNOSIS — F1520 Other stimulant dependence, uncomplicated: Secondary | ICD-10-CM | POA: Diagnosis not present

## 2020-02-11 NOTE — Progress Notes (Signed)
Psychotherapy Progress Note [facility] Amanda Moore, PhD LP  Patient ID: Amanda Dillon     MRN: [id number] Therapy format: Individual psychotherapy Date: 02/11/2020      Start: 5:18p     Stop: 6:08p     Time Spent: 50 min Location: In-person   Session narrative (presenting needs, interim history, self-report of stressors and symptoms, applications of prior therapy, status changes, and interventions made in session) Stresses at work from staff outages, seeing full beds and getting stymied on moving patients as needed, being hit with the effects of people not taking precautions with the pandemic.  Support/empathy provided.   More worried about friend Amanda Dillon (fellow patient), believes she maybe misusing her medication and becoming more confused.  Pt haunted by history of her friend killing himself her freshman year, hard to just trust Amanda Dillon will keep safe and exercise good judgment using medication, especially once she completes her move to town further away.  Validated OK to ask friend for time, talk with her, press her whether she would still be honest even if it's awkward.  Discussed particular concern about Amanda Dillon suspected mismanaging medication (Seroquel) to be unconscious more of the time, and wish to get in touch with her kids, which Amanda Dillon has asked her not to for fear of alienating them.  Compared understandings of Amanda Dillon's needs an attitudes toward help from her friend and how things would play with her children.  OK ultimately to contact her son, but it should be clearly about concern for whether friend is adequately care for and in touch while she faces a multitude of stresses.  Advised against contacting her daughter for risk of it coming across scolding and activating Amanda Dillon's known worst fear that she would be irrevocably abandoned by daughter, including reacting to "meddling" by 3rd party.  In any event, secure friends explicit consent or at least forewarn  before contacting her children directly.  Okay to ask her about medication use directly, including inquiring whether she is trying to force extra unconscious time.  Note that friend is also presently recovering from strep throat and may actually need extra rest because of it.  Therapeutic modalities: Cognitive Behavioral Therapy and Solution-Oriented/Positive Psychology  Mental Status/Observations:  Appearance:   Casual     Behavior:  Appropriate  Motor:  Normal  Speech/Language:   Clear and Coherent  Affect:  Appropriate  Mood:  anxious  Thought process:  normal  Thought content:    WNL  Sensory/Perceptual disturbances:    WNL  Orientation:  Fully oriented  Attention:  Good    Concentration:  Good  Memory:  WNL  Insight:    Good  Judgment:   Good  Impulse Control:  Good   Risk Assessment: Danger to Self: No Self-injurious Behavior: No Danger to Others: No Physical Aggression / Violence: No Duty to Warn: No Access to Firearms a concern: No  Assessment of progress:  progressing  Diagnosis:   ICD-10-CM   1. Major depressive disorder, recurrent episode, moderate (HCC)  F33.1   2. Social anxiety disorder  F40.10   3. Caffeine addiction (Amanda Dillon)  F15.20   4. 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 History of posttraumatic stress disorder (PTSD)  Z86.59   6. Alcohol dependence in remission (Amanda Dillon)  F10.21   7. S/P two oophorectomy surgeries  Z90.721    Plan:  . Discuss further with friend Amanda Dillon to reinforce agreement to openness and what she can/can't do  to engage her kids . Personally, will need to f/u on ice cream and soda habits . Other recommendations/advice as may be noted above . Continue to utilize previously learned skills ad lib . Maintain medication as prescribed and work faithfully with relevant prescriber(s) if any changes are desired or seem indicated . Call the clinic on-call service, present to ER, or call 911 if any life-threatening  psychiatric crisis Return 2-4 wks. . Already scheduled visit in this office 03/05/2020.  Amanda Serve, PhD Amanda Moore, PhD LP Clinical Psychologist, [facility] [address], [place], [place] [address] [phone]

## 2020-02-17 ENCOUNTER — Other Ambulatory Visit: Payer: Self-pay | Admitting: Physician Assistant

## 2020-03-05 ENCOUNTER — Ambulatory Visit: Payer: No Typology Code available for payment source | Admitting: Physician Assistant

## 2020-03-10 ENCOUNTER — Ambulatory Visit: Payer: No Typology Code available for payment source | Admitting: Psychiatry

## 2020-03-19 ENCOUNTER — Ambulatory Visit (INDEPENDENT_AMBULATORY_CARE_PROVIDER_SITE_OTHER): Payer: No Typology Code available for payment source | Admitting: Psychiatry

## 2020-03-19 ENCOUNTER — Other Ambulatory Visit: Payer: Self-pay

## 2020-03-19 DIAGNOSIS — F331 Major depressive disorder, recurrent, moderate: Secondary | ICD-10-CM

## 2020-03-19 DIAGNOSIS — F1520 Other stimulant dependence, uncomplicated: Secondary | ICD-10-CM

## 2020-03-19 DIAGNOSIS — Z8659 Personal history of other mental and behavioral disorders: Secondary | ICD-10-CM

## 2020-03-19 DIAGNOSIS — F1021 Alcohol dependence, in remission: Secondary | ICD-10-CM

## 2020-03-19 DIAGNOSIS — F5089 Other specified eating disorder: Secondary | ICD-10-CM
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
## 2020-03-29 ENCOUNTER — Encounter: Payer: Self-pay | Admitting: Physician Assistant

## 2020-03-29 ENCOUNTER — Ambulatory Visit (INDEPENDENT_AMBULATORY_CARE_PROVIDER_SITE_OTHER): Payer: No Typology Code available for payment source | Admitting: Physician Assistant

## 2020-03-29 ENCOUNTER — Other Ambulatory Visit: Payer: Self-pay

## 2020-03-29 VITALS — BP 152/97 | HR 88

## 2020-03-29 DIAGNOSIS — F4310 Post-traumatic stress disorder, unspecified: Secondary | ICD-10-CM | POA: Diagnosis not present

## 2020-03-29 DIAGNOSIS — F3341 Major depressive disorder, recurrent, in partial remission: Secondary | ICD-10-CM | POA: Diagnosis not present

## 2020-03-29 DIAGNOSIS — F4010 Social phobia, unspecified: Secondary | ICD-10-CM | POA: Diagnosis not present

## 2020-03-29 MED ORDER — VIIBRYD 40 MG PO TABS: 40.0000 mg | ORAL_TABLET | Freq: Every day | ORAL | 1 refills | Status: DC

## 2020-03-29 MED ORDER — PRAZOSIN HCL 1 MG PO CAPS: 1.0000 mg | ORAL_CAPSULE | Freq: Every day | ORAL | 1 refills | Status: DC

## 2020-03-29 MED ORDER — BUPROPION HCL ER (XL) 300 MG PO TB24: 300.0000 mg | ORAL_TABLET | Freq: Every day | ORAL | 1 refills | Status: DC

## 2020-03-29 NOTE — Progress Notes (Signed)
[facility]  Patient ID: Zenaya Ulatowski,  MRN: [id number]  PCP: Donald Prose, MD  Date of Evaluation: 03/29/2020 Time spent:20 minutes  Chief Complaint:  Chief Complaint    Depression      HISTORY/CURRENT STATUS: HPI for routine med check.  Starleen states she is doing well mentally.  Her medications are working well.  In July, we increased the Wellbutrin.  It has been a good change.  She is able to enjoy things, energy and motivation are good.  Appetite is decreased but she is having a cholecystectomy tomorrow, and her appetite is related to that.  Not isolating.  Work is going well.  She does not cry easily.  Sleeps well most of the time.  No nightmares.  No suicidal or homicidal thoughts.  Denies anxiety, as least most of the time. Doesn't c/o panic attacks, no SOB, palpitations, sweats, etc.  Patient denies increased energy with decreased need for sleep, no increased talkativeness, no racing thoughts, no impulsivity or risky behaviors, no increased spending, no increased libido, no grandiosity, no increased irritability or anger, and no hallucinations.  Denies dizziness, syncope, seizures, numbness, tingling, tremor, tics, unsteady gait, slurred speech, confusion. Denies muscle or joint pain, stiffness, or dystonia.  Individual Medical History/ Review of Systems: Changes? :Yes  Oophorectomy since last visit  Past medications for mental health diagnoses include: Uncertain.  She has taken a higher dose of Wellbutrin XL which did help but she has not needed that strength for a while.  Allergies: Patient has no known allergies.  Current Medications:  Current Outpatient Medications:    buPROPion (WELLBUTRIN XL) 300 MG 24 hr tablet, Take 1 tablet (300 mg total) by mouth daily., Disp: 90 tablet, Rfl: 1   estradiol (ESTRACE) 2 MG tablet, Take 1 tablet by mouth daily., Disp: , Rfl:    prazosin (MINIPRESS) 1 MG capsule, Take 1 capsule (1 mg total) by mouth at bedtime., Disp:  90 capsule, Rfl: 1   Vilazodone HCl (VIIBRYD) 40 MG TABS, Take 1 tablet (40 mg total) by mouth daily., Disp: 90 tablet, Rfl: 1   senna-docusate (SENOKOT-S) 8.6-50 MG tablet, Take 2 tablets by mouth at bedtime. FOR AFTER SURGERY, DO NOT TAKE IF HAVING DIARRHEA (Patient not taking: Reported on 06/16/2019), Disp: 60 tablet, Rfl: 1 Medication Side Effects: none  Family Medical/ Social History: Changes? No  MENTAL HEALTH EXAM:  Blood pressure (!) 152/97, pulse 88.There is no height or weight on file to calculate BMI.  General Appearance: Casual, Neat, Well Groomed and Obese  Eye Contact:  Good  Speech:  Clear and Coherent and Normal Rate  Volume:  Normal  Mood:  Euthymic  Affect:  Appropriate  Thought Process:  Goal Directed and Descriptions of Associations: Intact  Orientation:  Full (Time, Place, and Person)  Thought Content: Logical   Suicidal Thoughts:  No  Homicidal Thoughts:  No  Memory:  WNL  Judgement:  Good  Insight:  Good  Psychomotor Activity:  Normal  Concentration:  Concentration: Good and Attention Span: Good  Recall:  Good  Fund of Knowledge: Good  Language: Good  Assets:  Desire for Improvement  ADL's:  Intact  Cognition: WNL  Prognosis:  Good    DIAGNOSES:    ICD-10-CM   1. Recurrent major depressive disorder, in partial remission ([place])  F33.41   2. PTSD (post-traumatic stress disorder)  F43.10   3. Social anxiety disorder  F40.10     Receiving Psychotherapy: Yes  With Dr. Luan Moore  RECOMMENDATIONS: PDMP reviewed. I provided 20 mins of face to face time during this encounter. I am glad she is doing well mentally. Continue Wellbutrin XL 300 mg, 1 p.o. every morning. Continue Viibryd 40 mg 1 qd.  Continue Prazosin 1 mg po qhs.  Continue therapy with Dr. Luan Moore. Return in 6 months  Donnal Moat, [place]

## 2020-04-07 ENCOUNTER — Ambulatory Visit (INDEPENDENT_AMBULATORY_CARE_PROVIDER_SITE_OTHER): Payer: No Typology Code available for payment source | Admitting: Psychiatry

## 2020-04-07 ENCOUNTER — Other Ambulatory Visit: Payer: Self-pay

## 2020-04-07 ENCOUNTER — Ambulatory Visit: Payer: No Typology Code available for payment source | Admitting: Psychiatry

## 2020-04-07 DIAGNOSIS — F1520 Other stimulant dependence, uncomplicated: Secondary | ICD-10-CM

## 2020-04-07 DIAGNOSIS — Z636 Dependent relative needing care at home: Secondary | ICD-10-CM

## 2020-04-07 DIAGNOSIS — F3341 Major depressive disorder, recurrent, in partial remission: Secondary | ICD-10-CM | POA: Diagnosis not present

## 2020-04-07 DIAGNOSIS — F5089 Other specified eating disorder: Secondary | ICD-10-CM

## 2020-04-07 DIAGNOSIS — F4310 Post-traumatic stress disorder, unspecified: Secondary | ICD-10-CM | POA: Diagnosis not present

## 2020-04-07 DIAGNOSIS — F1021 Alcohol dependence, in remission: Secondary | ICD-10-CM

## 2020-04-07 DIAGNOSIS — F4010 Social phobia, unspecified: Secondary | ICD-10-CM

## 2020-04-07 DIAGNOSIS — Z90721 Acquired absence of ovaries, unilateral: Secondary | ICD-10-CM

## 2020-04-07 NOTE — Progress Notes (Signed)
Psychotherapy Progress Note [facility] Luan Moore, PhD LP  Patient ID: Amanda Dillon     MRN: [id number] Therapy format: Individual psychotherapy Date: 04/07/2020      Start: 4:20p     Stop: 5:05p     Time Spent: 45 min Location: In-person   Session narrative (presenting needs, interim history, self-report of stressors and symptoms, applications of prior therapy, status changes, and interventions made in session) Mood less depressed, but knows pandemic has led her more to shut in, plus her multifaceted pain condition the past two years, resolving with 2-part oophorectomy (1st surgery 04/14/19) and now gall bladder surgery a week ago.  Can still be worried she will relapse in depression, which was itself quite frightening several years ago, and in fact lasted many years before treatment.  Surgery successful, about a week out from freedom to do some light walking.  Has an app that can guide her in steps of exercise once she is freer to exert herself.    Began weaning caffeine habit with surgery.  Did not get that far before surgery.  Has established caffeine-free bedroom, notices less desire for it, had withdrawal HA yesterday.  Currently 50% of pre-surgery level.  Last intake by 7pm.  Replacing some with water.  Has cut down ice cream in respect of gall bladder recovery and effort to tamp down addictive food habit.  Bowels looser, trying to manage.    PT got her car cleaned before they came, being self-conscious about clutter and signs of self-care issues.  Mom and dad came up during her convalescence.  Mom organized her mail and records, which was very helpful, although it meant running into things that could have made her feel overexposed.  Hospital admission included noting blood pressure medicine, but no one surmised that it was PTSD treatment.  Unopened insurance EOBs may have had diagnosis but no mention made.  Mom came across [facility] material but was immediately  respectful of her privacy, did not disturb or snoop.  Impressive even now to be taking about getting along well with her parents after decades-long journey feeling she could only survive her [place].  Plans for TG to visit sister in [place], with most of the family.  No apprehension noted, as would have been typical of depression.  Questions taken re. boundaries and judgment relating to her friend [name] and perceived chronic risks of self-harm.  In fact, [name] was scheduled today just before [name], and ongoing permissions from [place] apply to acknowledge and hear from [name] about her concerns and advise as I see fit.  Clarified ground rules and understandings about taking emergency action and involving therapist, family, or emergency services and validated service already provided lat week and this addressing two episodes of altered mental status.  Therapeutic modalities: Cognitive Behavioral Therapy and Solution-Oriented/Positive Psychology  Mental Status/Observations:  Appearance:   Casual     Behavior:  Appropriate  Motor:  Normal  Speech/Language:   Clear and Coherent  Affect:  Appropriate  Mood:  anxious and brighter  Thought process:  normal  Thought content:    WNL  Sensory/Perceptual disturbances:    WNL  Orientation:  Fully oriented  Attention:  Good    Concentration:  Good  Memory:  WNL  Insight:    Good  Judgment:   Good  Impulse Control:  Good   Risk Assessment: Danger to Self: No Self-injurious Behavior: No Danger to Others: No Physical Aggression / Violence: No Duty to Warn: No  Access to Firearms a concern: No  Assessment of progress:  progressing  Diagnosis:   ICD-10-CM   1. Recurrent major depressive disorder, in partial remission ([place])  F33.41   2. PTSD (post-traumatic stress disorder)  F43.10   3. Social anxiety disorder  F40.10   4. 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 Alcohol dependence in  remission ([place])  F10.21   7. S/P two oophorectomies and cholecystectomy  Z90.721   8. Caregiver stress  Z63.6    Plan:  . Next steps with caffeine -- seek a 5pm curfew, work overall soda down to one big cup (44oz.) . Ease into exercise as allowed/recommended by physician . For [name] -- identify local emergency contact for her, keep handy in case of need, and make before-needed agreement that she will call the appropriate emergency service (law enforcement for safety check if suicidal, ambulance if physically or mentally incapacitated).  If [name] is reluctant, best frame the advice for medical reasons, e.g., could be a stroke causing altered mental status, would hate to neglect that, and in good conscience must call ambulance for her. . Other recommendations/advice as may be noted above . Continue to utilize previously learned skills ad lib . Maintain medication as prescribed and work faithfully with relevant prescriber(s) if any changes are desired or seem indicated . Call the clinic on-call service, present to ER, or call 911 if any life-threatening psychiatric crisis Return in about 4 weeks (around 05/05/2020) for time as available. . Already scheduled visit in this office 05/05/2020.  [name], PhD Luan Moore, PhD LP Clinical Psychologist, [facility] [address] [place], [place] [address] [phone]

## 2020-05-05 ENCOUNTER — Other Ambulatory Visit: Payer: Self-pay

## 2020-05-05 ENCOUNTER — Ambulatory Visit: Payer: No Typology Code available for payment source | Admitting: Psychiatry

## 2020-05-05 DIAGNOSIS — F1021 Alcohol dependence, in remission: Secondary | ICD-10-CM

## 2020-05-05 DIAGNOSIS — Z724 Inappropriate diet and eating habits: Secondary | ICD-10-CM

## 2020-05-05 DIAGNOSIS — Z636 Dependent relative needing care at home: Secondary | ICD-10-CM

## 2020-05-05 DIAGNOSIS — F1520 Other stimulant dependence, uncomplicated: Secondary | ICD-10-CM

## 2020-05-05 DIAGNOSIS — Z90721 Acquired absence of ovaries, unilateral: Secondary | ICD-10-CM

## 2020-05-05 DIAGNOSIS — F4010 Social phobia, unspecified: Secondary | ICD-10-CM

## 2020-05-05 DIAGNOSIS — Z9049 Acquired absence of other specified parts of digestive tract: Secondary | ICD-10-CM

## 2020-05-05 DIAGNOSIS — F3341 Major depressive disorder, recurrent, in partial remission: Secondary | ICD-10-CM

## 2020-05-05 DIAGNOSIS — Z8659 Personal history of other mental and behavioral disorders: Secondary | ICD-10-CM

## 2020-05-05 NOTE — Progress Notes (Signed)
Psychotherapy Progress Note [facility] Luan Moore, PhD LP  Patient ID: Amanda Dillon     MRN: [id number] Therapy format: Individual psychotherapy Date: 05/05/2020      Start: 5:05p     Stop: 5:55p     Time Spent: 50 min Location: In-person   Session narrative (presenting needs, interim history, self-report of stressors and symptoms, applications of prior therapy, status changes, and interventions made in session) Successfully weaning caffeine, has come through difficult headache, and observing caffeine curfew by lunch.  Successfully converting to water and caffeine-free soda.  Reducing sugar has come naturally as she does.  More salads.  Clear that she's sleeping more soundly as she has decaffeinated.  Typical these days to get 6.5 - 7 hrs with 3-4 breaks with dogs waking her to go out, or snoring loudly.  Will most likely go back to expelling them from the bedroom at night.    Had gallbladder removed, so digestion is still returning to normal, and walking is limited, with hope of returning to regular walking in maybe a week, running again after that.  Knows well how good running has been for her mood before, want it back.  Day surgery let it work out such that it never came to a colleague seeing her chart.    Grave concern for friend [name], fellow patient, who has been persistently suicidal.  Given prior consents, discussed understanding of her condition and action plans Basilia may reasonably take for her safety and wellbeing.  In particular, authorized her plan to contact [name] daughter to help break through what broadly seems to be a cycle of misunderstanding between them contributing to suicidal despair on [name] part.    Therapeutic modalities: Cognitive Behavioral Therapy, Solution-Oriented/Positive Psychology and Interpersonal  Mental Status/Observations:  Appearance:   Casual     Behavior:  Appropriate  Motor:  Normal  Speech/Language:   Clear and Coherent   Affect:  Appropriate  Mood:  concerned  Thought process:  normal  Thought content:    WNL  Sensory/Perceptual disturbances:    mild hearing deficit   Orientation:  Fully oriented  Attention:  Good    Concentration:  Good  Memory:  WNL  Insight:    Good  Judgment:   Good  Impulse Control:  Good   Risk Assessment: Danger to Self: No Self-injurious Behavior: No Danger to Others: No Physical Aggression / Violence: No Duty to Warn: No Access to Firearms a concern: No  Assessment of progress:  progressing well  Diagnosis:   ICD-10-CM   1. Recurrent major depressive disorder, in partial remission ([place])  F33.41   2. 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 Eating problem  Z72.4   4. Caregiver stress  Z63.6   5. Social anxiety disorder  F40.10   6. S/P cholecystectomy  Z90.49   7. S/P two oophorectomies and cholecystectomy  Z90.721   8. History of posttraumatic stress disorder (PTSD)  Z86.59   9. Alcohol dependence in remission ([place])  F10.21    Plan:  . Continue with medical recommendations for pacing and promoting recovery from cholecystectomy . Continue to wean caffeine and sugar, restore healthy variety in diet . Continue restoring physical activity and exercise as able . Endorse actively helping [name] by intervening with daughter to clarify [name] condition and needs, what daughter can legitimately do, and how daughter's reactions and communication habits have been confusing and at least unintentionally painful . Other recommendations/advice as may be noted above . Continue  to utilize previously learned skills ad lib . Maintain medication as prescribed and work faithfully with relevant prescriber(s) if any changes are desired or seem indicated . Call the clinic on-call service, present to ER, or call 911 if any life-threatening psychiatric crisis Return 2-4 weeks. . Already scheduled visit in this office 06/09/2020.  [name], PhD Luan Moore, PhD LP Clinical  Psychologist, [facility] [address] [place], [place] [address] [phone]

## 2020-06-09 ENCOUNTER — Ambulatory Visit (INDEPENDENT_AMBULATORY_CARE_PROVIDER_SITE_OTHER): Payer: No Typology Code available for payment source | Admitting: Psychiatry

## 2020-06-09 ENCOUNTER — Other Ambulatory Visit: Payer: Self-pay

## 2020-06-09 DIAGNOSIS — Z636 Dependent relative needing care at home: Secondary | ICD-10-CM | POA: Diagnosis not present

## 2020-06-09 DIAGNOSIS — Z90721 Acquired absence of ovaries, unilateral: Secondary | ICD-10-CM

## 2020-06-09 DIAGNOSIS — F1520 Other stimulant dependence, uncomplicated: Secondary | ICD-10-CM | POA: Diagnosis not present

## 2020-06-09 DIAGNOSIS — F1021 Alcohol dependence, in remission: Secondary | ICD-10-CM

## 2020-06-09 DIAGNOSIS — F5089 Other specified eating disorder: Secondary | ICD-10-CM

## 2020-06-09 DIAGNOSIS — F325 Major depressive disorder, single episode, in full remission: Secondary | ICD-10-CM | POA: Diagnosis not present

## 2020-06-09 DIAGNOSIS — Z8659 Personal history of other mental and behavioral disorders: Secondary | ICD-10-CM

## 2020-06-09 NOTE — Progress Notes (Signed)
Psychotherapy Progress Note [facility] Amanda Moore, PhD LP  Patient ID: Amanda Dillon     MRN: [id number] Therapy format: Individual psychotherapy Date: 06/09/2020      Start: 5:08p     Stop: 5:55p     Time Spent: 47 min Location: In-person   Session narrative (presenting needs, interim history, self-report of stressors and symptoms, applications of prior therapy, status changes, and interventions made in session) A sister offered up Amanda Dillon as a contact to a friend about living/dealing with anxiety, turned out to be helpful and uplifting experience sharing experience/hope and breaking down stigma.    Largely doing well now, primarily worried about Amanda Dillon, who got through holidays much better than expected thanks to Amanda Dillon facilitating communication with her daughter, but lately has been morose again and dehydrated in addition to chronic pain syndrome, to the point where she has at least flirted with delirium and today saw her PCP, with new concerns for thyroid or gall bladder issues.  Amanda Dillon did make the call as planned to Amanda Dillon's daughter Amanda Dillon before Christmas, which turned into a very beneficial process and a welcome gathering at Amanda Dillon's including both Amanda Dillon and Amanda Dillon's kids.  Unfortunately, now Amanda Dillon is fighting again with Amanda Dillon, but she has heard Amanda Dillon tell her frankly she's worried and seems to be heeding the call to restrain herself from drastic actions.  Extra salient for Amanda Dillon in that she has experienced the suicide of a friend years ago and has been able to lay it out with Amanda Dillon she does not want to have to do that again.  Still caffeine free, still fairly large amounts of soda but tapering.  Bought a fitness device, will be pushing up exercise in the near future.  Commended on re-engaging a broad program of self-care and giving her body back a good chance at further health and return to fitness she enjoyed before ovarian cysts and pain started taking their  toll.  Therapeutic modalities: Cognitive Behavioral Therapy, Solution-Oriented/Positive Psychology, Ego-Supportive and Interpersonal  Mental Status/Observations:  Appearance:   Casual     Behavior:  Appropriate  Motor:  Normal  Speech/Language:   Clear and Coherent  Affect:  Appropriate  Mood:  normal and concerned  Thought process:  normal  Thought content:    WNL  Sensory/Perceptual disturbances:    WNL  Orientation:  Fully oriented  Attention:  Good    Concentration:  Good  Memory:  WNL  Insight:    Good  Judgment:   Good  Impulse Control:  Good   Risk Assessment: Danger to Self: No Self-injurious Behavior: No Danger to Others: No Physical Aggression / Violence: No Duty to Warn: No Access to Firearms a concern: No  Assessment of progress:  progressing  Diagnosis:   ICD-10-CM   1. Major depressive disorder in full remission, unspecified whether recurrent ([place])  F32.5   2. Caregiver stress  Z63.6   3. 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 S/P two oophorectomies and cholecystectomy  Z90.721   5. History of posttraumatic stress disorder (PTSD)  Z86.59   6. Alcohol dependence in remission ([place])  F10.21   7. Other disorder of eating - carbohydrate addiction  F50.89    Plan:   Endorse continuing support and supportive confrontation of Amanda Dillon, intervene as sees fit, trust her judgment  Continue with medical recommendations for pacing and promoting recovery from cholecystectomy  Continue to wean caffeine and sugar, restore healthy variety in diet  Continue restoring physical activity and  exercise as able . Other recommendations/advice as may be noted above . Continue to utilize previously learned skills ad lib . Maintain medication as prescribed and work faithfully with relevant prescriber(s) if any changes are desired or seem indicated . Call the clinic on-call service, present to ER, or call 911 if any life-threatening psychiatric crisis Return in about 4 weeks  (around 07/07/2020). . Already scheduled visit in this office 07/07/2020.  [name], PhD Amanda Moore, PhD LP Clinical Psychologist, [facility] [address] [place], [place] [address] [phone]

## 2020-07-07 ENCOUNTER — Other Ambulatory Visit: Payer: Self-pay

## 2020-07-07 ENCOUNTER — Ambulatory Visit (INDEPENDENT_AMBULATORY_CARE_PROVIDER_SITE_OTHER): Payer: No Typology Code available for payment source | Admitting: Psychiatry

## 2020-07-07 DIAGNOSIS — F4010 Social phobia, unspecified: Secondary | ICD-10-CM | POA: Diagnosis not present

## 2020-07-07 DIAGNOSIS — Z566 Other physical and mental strain related to work: Secondary | ICD-10-CM

## 2020-07-07 DIAGNOSIS — Z724 Inappropriate diet and eating habits: Secondary | ICD-10-CM

## 2020-07-07 DIAGNOSIS — F3341 Major depressive disorder, recurrent, in partial remission: Secondary | ICD-10-CM | POA: Diagnosis not present

## 2020-07-07 DIAGNOSIS — F1520 Other stimulant dependence, uncomplicated: Secondary | ICD-10-CM

## 2020-07-07 DIAGNOSIS — Z8659 Personal history of other mental and behavioral disorders: Secondary | ICD-10-CM

## 2020-07-07 DIAGNOSIS — F1021 Alcohol dependence, in remission: Secondary | ICD-10-CM

## 2020-07-07 NOTE — Progress Notes (Signed)
Psychotherapy Progress Note [facility] Amanda Moore, PhD LP  Patient ID: Amanda Dillon     MRN: [id number] Therapy format: Individual psychotherapy Date: 07/07/2020      Start: 5:06p     Stop: 5:52p     Time Spent: 46 min Location: In-person   Session narrative (presenting needs, interim history, self-report of stressors and symptoms, applications of prior therapy, status changes, and interventions made in session) Back in [place] program returning to running.  Using Noom to help with food issues.  Down 15 lbs.  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  Does well when she writes out personal goals.  Not sure what she wants for a personal or work goal but knows she would like to be less alone.  Walks with hairdresser Amanda Dillon (gay, safe).  Friend Amanda Dillon shy, tends to be one-sided interaction, but something.  Reframed goal-setting to be more about doing the things that may alleviate loneliness and free herself to engage rather than achieving less loneliness -- identifying persons or occasions of interest, exercising curiosity instead of trying to "fix" something, and taking palatable chances with acquaintances or strangers with full right to back out or not repeat if it doesn't work right.  Worked through apprehensions about her church [employer], not physically attended in 9 yrs) and possible people who recall how overdriven, jealous, and upset she became with the relationship with Amanda Dillon (singles group friend and former patient of [place]), enough to embarrass herself.  Assured living memory is probably scant in the first place, forgiving in the second, but perfectly OK to fresh-start with another church environment, and possibly fruitful to ask a staff member about other midlife singles seeking trustworthy friends.  Affirmed how now she is more motivated by boredom and wanting more, not by a more consuming fear of loneliness or being left out, a  position of some strength.  Discussed possibility of work-affiliated social occasions, like one she went to last weekend, and organized meet-ups as through [email or web address], and freedom to just peruse, not commit, or commit to show for one, with full freedom to withdraw quickly if it does not strike her fancy.  Therapeutic modalities: Cognitive Behavioral Therapy and Solution-Oriented/Positive Psychology  Mental Status/Observations:  Appearance:   Casual     Behavior:  Appropriate  Motor:  Normal  Speech/Language:   Clear and Coherent  Affect:  brighter, more responsive, appropriate  Mood:  stresses, but more positive  Thought process:  normal  Thought content:    WNL  Sensory/Perceptual disturbances:    WNL  Orientation:  Fully oriented  Attention:  Good    Concentration:  Good  Memory:  WNL  Insight:    Good  Judgment:   Good  Impulse Control:  Good   Risk Assessment: Danger to Self: No Self-injurious Behavior: No Danger to Others: No Physical Aggression / Violence: No Duty to Warn: No Access to Firearms a concern: No  Assessment of progress:  progressing  Diagnosis: No diagnosis found. Plan:  . Continue with diet & fitness efforts . Continue curbing caffeine . Identify and pursue social connections and occasions further as motivated . Other recommendations/advice as may be noted above . Continue to utilize previously learned skills ad lib . Maintain medication as prescribed and work faithfully with relevant prescriber(s) if any changes are desired or seem indicated . Call the clinic on-call service, present to ER, or call 911 if any life-threatening psychiatric  crisis Return for session(s) already scheduled. . Already scheduled visit in this office 08/04/2020.  [name], PhD Amanda Moore, PhD LP Clinical Psychologist, [facility] [address] [place], [place] [address] [phone]

## 2020-08-04 ENCOUNTER — Ambulatory Visit (INDEPENDENT_AMBULATORY_CARE_PROVIDER_SITE_OTHER): Payer: No Typology Code available for payment source | Admitting: Psychiatry

## 2020-08-04 DIAGNOSIS — F4010 Social phobia, unspecified: Secondary | ICD-10-CM

## 2020-08-04 DIAGNOSIS — F3341 Major depressive disorder, recurrent, in partial remission: Secondary | ICD-10-CM

## 2020-08-04 DIAGNOSIS — F5089 Other specified eating disorder: Secondary | ICD-10-CM

## 2020-08-04 DIAGNOSIS — F1021 Alcohol dependence, in remission: Secondary | ICD-10-CM

## 2020-08-04 DIAGNOSIS — Z8659 Personal history of other mental and behavioral disorders: Secondary | ICD-10-CM

## 2020-08-04 DIAGNOSIS — F411 Generalized anxiety disorder: Secondary | ICD-10-CM
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
## 2020-09-08 ENCOUNTER — Ambulatory Visit: Payer: No Typology Code available for payment source | Admitting: Psychiatry

## 2020-09-27 ENCOUNTER — Ambulatory Visit: Payer: No Typology Code available for payment source | Admitting: Physician Assistant

## 2020-10-06 ENCOUNTER — Ambulatory Visit (INDEPENDENT_AMBULATORY_CARE_PROVIDER_SITE_OTHER): Payer: No Typology Code available for payment source | Admitting: Psychiatry

## 2020-10-06 ENCOUNTER — Other Ambulatory Visit: Payer: Self-pay

## 2020-10-06 DIAGNOSIS — F1021 Alcohol dependence, in remission: Secondary | ICD-10-CM | POA: Diagnosis not present

## 2020-10-06 DIAGNOSIS — F4310 Post-traumatic stress disorder, unspecified: Secondary | ICD-10-CM

## 2020-10-06 DIAGNOSIS — Z724 Inappropriate diet and eating habits: Secondary | ICD-10-CM | POA: Diagnosis not present

## 2020-10-06 DIAGNOSIS — H9190 Unspecified hearing loss, unspecified ear: Secondary | ICD-10-CM

## 2020-10-06 DIAGNOSIS — F3341 Major depressive disorder, recurrent, in partial remission: Secondary | ICD-10-CM
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
## 2020-10-08 ENCOUNTER — Encounter: Payer: Self-pay | Admitting: Physician Assistant

## 2020-10-08 ENCOUNTER — Other Ambulatory Visit: Payer: Self-pay

## 2020-10-08 ENCOUNTER — Ambulatory Visit (INDEPENDENT_AMBULATORY_CARE_PROVIDER_SITE_OTHER): Payer: No Typology Code available for payment source | Admitting: Physician Assistant

## 2020-10-08 VITALS — BP 146/81 | HR 76

## 2020-10-08 DIAGNOSIS — R030 Elevated blood-pressure reading, without diagnosis of hypertension: Secondary | ICD-10-CM

## 2020-10-08 DIAGNOSIS — F4310 Post-traumatic stress disorder, unspecified: Secondary | ICD-10-CM

## 2020-10-08 DIAGNOSIS — F3341 Major depressive disorder, recurrent, in partial remission: Secondary | ICD-10-CM | POA: Diagnosis not present

## 2020-10-08 DIAGNOSIS — F411 Generalized anxiety disorder: Secondary | ICD-10-CM

## 2020-10-08 MED ORDER — VIIBRYD 40 MG PO TABS: 40.0000 mg | ORAL_TABLET | Freq: Every day | ORAL | 1 refills | Status: DC

## 2020-10-08 MED ORDER — PRAZOSIN HCL 2 MG PO CAPS: 2.0000 mg | ORAL_CAPSULE | Freq: Every day | ORAL | 1 refills | Status: DC

## 2020-10-08 MED ORDER — BUPROPION HCL ER (XL) 300 MG PO TB24: 300.0000 mg | ORAL_TABLET | Freq: Every day | ORAL | 1 refills | Status: DC
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
## 2020-11-03 ENCOUNTER — Ambulatory Visit (INDEPENDENT_AMBULATORY_CARE_PROVIDER_SITE_OTHER): Payer: No Typology Code available for payment source | Admitting: Psychiatry

## 2020-11-03 ENCOUNTER — Other Ambulatory Visit: Payer: Self-pay

## 2020-11-03 DIAGNOSIS — Z8659 Personal history of other mental and behavioral disorders: Secondary | ICD-10-CM | POA: Diagnosis not present

## 2020-11-03 DIAGNOSIS — F5089 Other specified eating disorder: Secondary | ICD-10-CM

## 2020-11-03 DIAGNOSIS — F1021 Alcohol dependence, in remission: Secondary | ICD-10-CM

## 2020-11-03 DIAGNOSIS — F411 Generalized anxiety disorder: Secondary | ICD-10-CM

## 2020-11-03 NOTE — Progress Notes (Signed)
Psychotherapy Progress Note [facility] Luan Moore, PhD LP  Patient ID: Amanda Dillon     MRN: [id number] Therapy format: Individual psychotherapy Date: 11/03/2020      Start: 5:05p     Stop: 5:55p     Time Spent: 50 min Location: In-person   Session narrative (presenting needs, interim history, self-report of stressors and symptoms, applications of prior therapy, status changes, and interventions made in session) Increased worry about friend/fellow pt [name], who has been cancelling appointments and isolating and is probably malnourished somehow.  She sounds to be anorexic-like, but gaining weight in her midsection, with low BP, and obsessing about her shape.  Did get her to stay over with her one weekend, but irritated at some increasingly dependent and unrealistic requests.  Caught between her own fear of having another friend suicide and feeling a little manipulated.  Reframed concern for manipulation, agreed with impression that [name] needs help engaging further support services and is self-sabotaging, and agreed it is plausible she is eroding her own care and support in order to help her chances of dying of apparently natural causes.  Strategized about response, encouraging frankness about how it looks, whether she'd be willing to say if she is trying to die, and securing pledges to live, get in touch, and get better acquainted with services available to her.  Nutrition and fitness programs continue.  Therapeutic modalities: Cognitive Behavioral Therapy, Solution-Oriented/Positive Psychology, and Assertiveness/Communication  Mental Status/Observations:  Appearance:   Well Groomed     Behavior:  Appropriate  Motor:  Normal  Speech/Language:   Clear and Coherent  Affect:  Appropriate  Mood:  anxious  Thought process:  normal  Thought content:    WNL  Sensory/Perceptual disturbances:    WNL  Orientation:  Fully oriented  Attention:  Good    Concentration:   Good  Memory:  WNL  Insight:    Good  Judgment:   Good  Impulse Control:  Good   Risk Assessment: Danger to Self: No Self-injurious Behavior: No Danger to Others: No Physical Aggression / Violence: No Duty to Warn: No Access to Firearms a concern: No  Assessment of progress:  progressing  Diagnosis:   ICD-10-CM   1. Generalized anxiety disorder  F41.1     2. History of posttraumatic stress disorder (PTSD)  Z86.59     3. Alcohol dependence in longterm remission (H[facility])  F10.21     4. 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 [name]: emphasize "fierce love" and "fierce hope", "I love you too much to __" messages, seeking local services like Meals on Wheels, and endorsement to notify her son that it is not a safe time to give back her guns, make a settlement about the idea of "tipping"/paying Annalena for her help, OK to challenge and refigure excessive gift-giving. Likely need for other services local to [name] May certainly continue to provide an overnight way station for [place] trips and an emotional refuge as able Endorse encouraging [name] back to therapy and notifying [name] son of her own assessment that it is not safe to return weapons Other recommendations/advice as may be noted above Continue to utilize previously learned skills ad lib Maintain medication as prescribed and work faithfully with relevant prescriber(s) if any changes are desired or seem indicated Call the clinic on-call service, present to ER, or call 911 if any life-threatening psychiatric crisis Return in about 4 weeks (around  12/01/2020) for available earlier @ PT's need. Already scheduled visit in this office 12/01/2020.  [name], PhD Luan Moore, PhD LP Clinical Psychologist, [facility] [address] [place], [place] [address] [phone]

## 2020-12-01 ENCOUNTER — Ambulatory Visit (INDEPENDENT_AMBULATORY_CARE_PROVIDER_SITE_OTHER): Payer: No Typology Code available for payment source | Admitting: Psychiatry

## 2020-12-01 ENCOUNTER — Other Ambulatory Visit: Payer: Self-pay

## 2020-12-01 DIAGNOSIS — F1021 Alcohol dependence, in remission: Secondary | ICD-10-CM

## 2020-12-01 DIAGNOSIS — F4310 Post-traumatic stress disorder, unspecified: Secondary | ICD-10-CM | POA: Diagnosis not present

## 2020-12-01 DIAGNOSIS — Z724 Inappropriate diet and eating habits: Secondary | ICD-10-CM | POA: Diagnosis not present

## 2020-12-01 DIAGNOSIS — F411 Generalized anxiety disorder: Secondary | ICD-10-CM | POA: Diagnosis not present

## 2020-12-01 DIAGNOSIS — F3342 Major depressive disorder, recurrent, in full remission: Secondary | ICD-10-CM | POA: Diagnosis not present

## 2020-12-01 NOTE — Progress Notes (Signed)
Psychotherapy Progress Note [facility] Luan Moore, PhD LP  Patient ID: Amanda Dillon     MRN: [id number] Therapy format: Individual psychotherapy Date: 12/01/2020      Start: 5:07p     Stop: 5:53p     Time Spent: 46 min Location: In-person   Session narrative (presenting needs, interim history, self-report of stressors and symptoms, applications of prior therapy, status changes, and interventions made in session) Addressed stress with, and strategy for, friend [name], who came close to suicide this weekend.  Fielded a middle of the night text revealing how [name] had sought a gun and was refused, wasn't sure whether to call then or let it hold, called in the morning and was met with stubborn resistance to talking about it.  Knows how it was triggered by a letdown in an enmeshed relationship with her daughter.  Quandaries particularly about how firm to be and how to make sure she's not going to make a mistake and be at fault for letting her die.    Assured that if [name] ever does commit, despite all our best efforts, it will not be Amanda Dillon's fault, it would be the disease of depression claiming her.  Meanwhile, coached in frankness and getting [name] to resolve double messages about help.  No c/o medical issues, appears to be continuing progress regulating sugar and engaging exercise.  Therapeutic modalities: Cognitive Behavioral Therapy and Solution-Oriented/Positive Psychology  Mental Status/Observations:  Appearance:   Casual     Behavior:  Appropriate  Motor:  Normal  Speech/Language:   Clear and Coherent  Affect:  Appropriate  Mood:  normal and situational anxiety  Thought process:  normal  Thought content:    WNL  Sensory/Perceptual disturbances:    WNL  Orientation:  Fully oriented  Attention:  Good    Concentration:  Good  Memory:  WNL  Insight:    Good  Judgment:   Good  Impulse Control:  Good   Risk Assessment: Danger to Self: No Self-injurious  Behavior: No Danger to Others: No Physical Aggression / Violence: No Duty to Warn: No Access to Firearms a concern: No  Assessment of progress:  progressing  Diagnosis:   ICD-10-CM   1. Generalized anxiety disorder  F41.1     2. PTSD (post-traumatic stress disorder)  F43.10     3. Recurrent major depressive disorder, in full remission ([place])  F33.42     4. Eating problem  Z72.4     5. Alcohol dependence in longterm remission ([place])  F10.21      Plan:  OK to be firm about getting [name] to reconcile double messages asking/declining support, and frank about the value of her living past the impulse to die, e.g., the worse pain is temporary, don't murder my friend, don't kill progress that's actually being made, the immediate pain is daughter's emotional illness and not a reason to kill/die, you've been working hard for the freedom to validly ask what you need from family so don't let the mood kill the opportunity to see that through) Other recommendations/advice as may be noted above Continue to utilize previously learned skills ad lib Maintain medication as prescribed and work faithfully with relevant prescriber(s) if any changes are desired or seem indicated Call the clinic on-call service, present to ER, or call 911 if any life-threatening psychiatric crisis Return in about 1 month (around 01/01/2021). Already scheduled visit in this office 12/08/2020.  [name], PhD Luan Moore, PhD LP Clinical Psychologist, [facility]  [facility] [address], [place], [place] [address] [phone]

## 2020-12-08 ENCOUNTER — Ambulatory Visit: Payer: No Typology Code available for payment source | Admitting: Physician Assistant

## 2021-01-05 ENCOUNTER — Ambulatory Visit: Payer: No Typology Code available for payment source | Admitting: Psychiatry

## 2021-01-31 ENCOUNTER — Ambulatory Visit: Payer: No Typology Code available for payment source | Admitting: Physician Assistant

## 2021-02-08 IMAGING — CT CT ABD-PELV W/O CM
1 of 2 series · 14 of 32 positions shown, 19 images · non-contrast
Comparison: None.

CLINICAL DATA: Acute bilateral abdominal pain.

EXAM:
CT ABDOMEN AND PELVIS WITHOUT CONTRAST
TECHNIQUE: Multidetector CT imaging of the abdomen and pelvis was performed
following the standard protocol without IV contrast.

[Series 2: abd/pelvis w/(date) · axial · 0.76mm/px · z∈[-475,-50]mm · 14 of 95 slices shown, 19 images]
[im 5/95  soft-tissue]
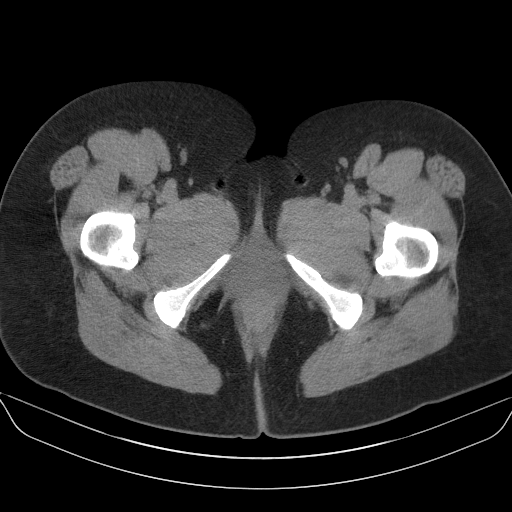
[im 5/95  bone]
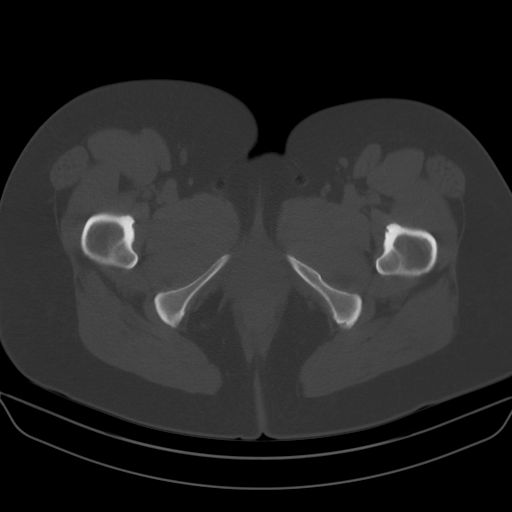
[im 15/95  soft-tissue]
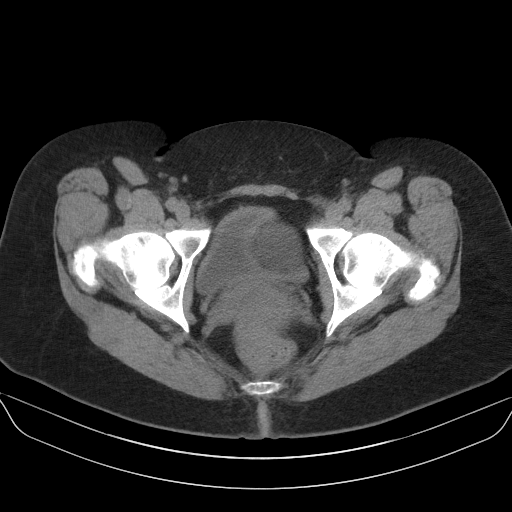
[im 20/95  soft-tissue]
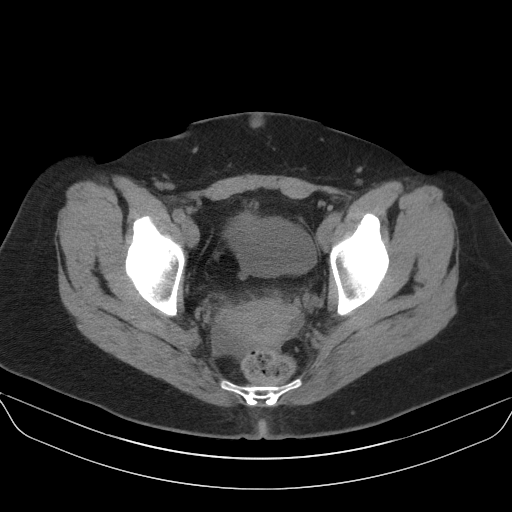
[im 25/95  soft-tissue]
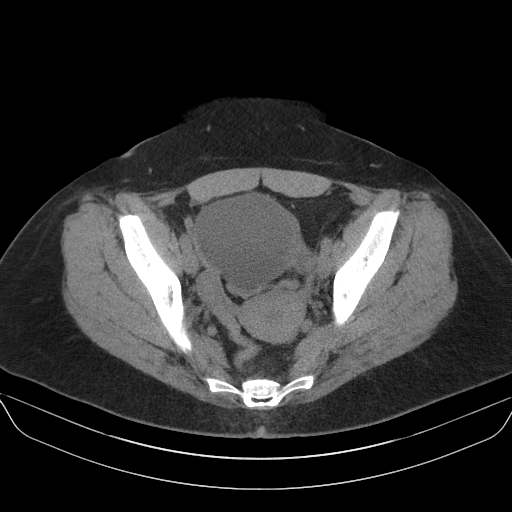
[im 35/95  soft-tissue]
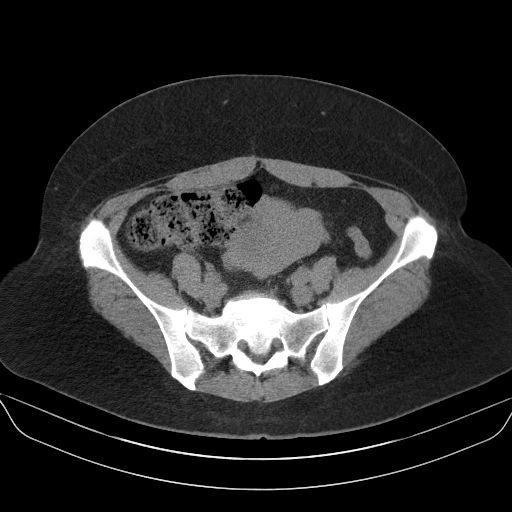
[im 40/95  soft-tissue]
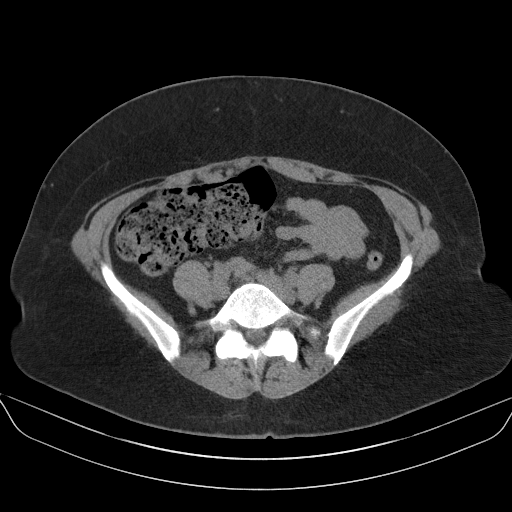
[im 50/95  soft-tissue]
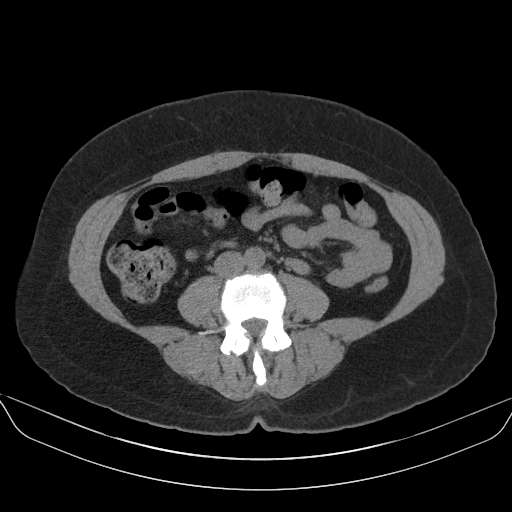
[im 55/95  soft-tissue]
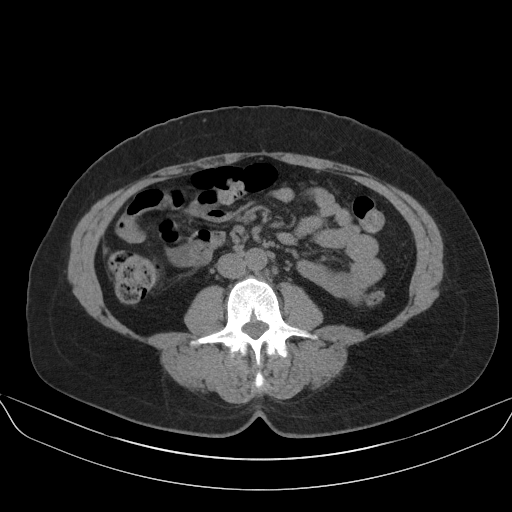
[im 60/95  soft-tissue]
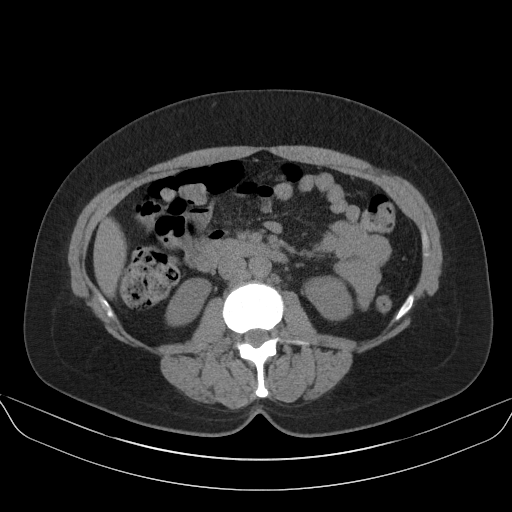
[im 60/95  bone]
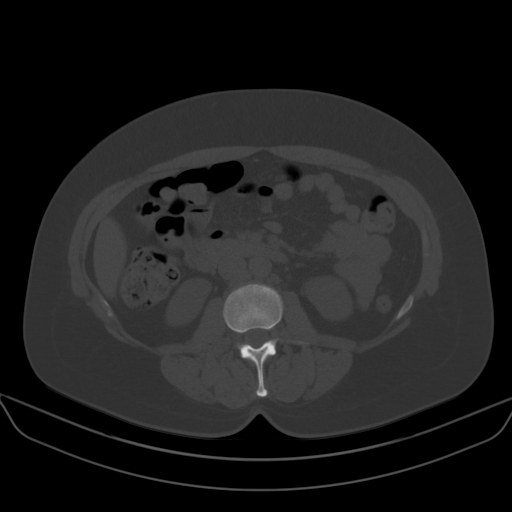
[im 70/95  soft-tissue]
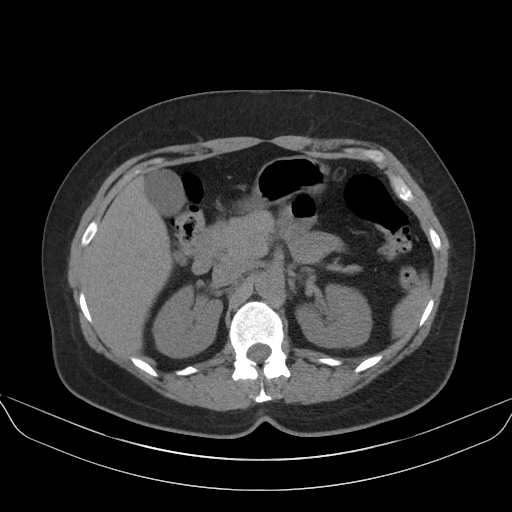
[im 75/95  soft-tissue]
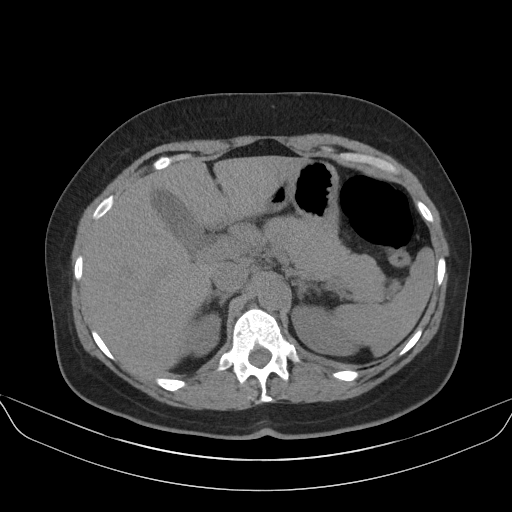
[im 75/95  lung]
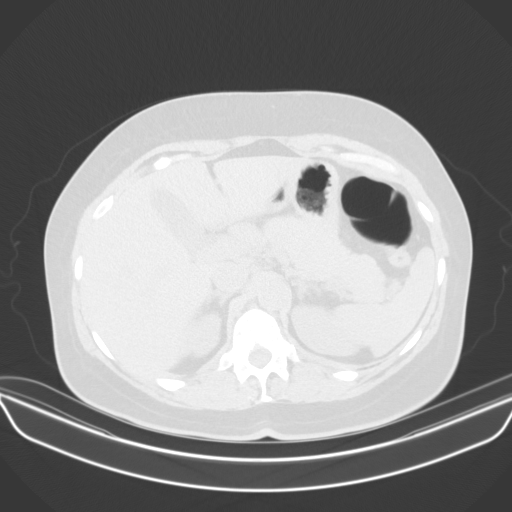
[im 80/95  soft-tissue]
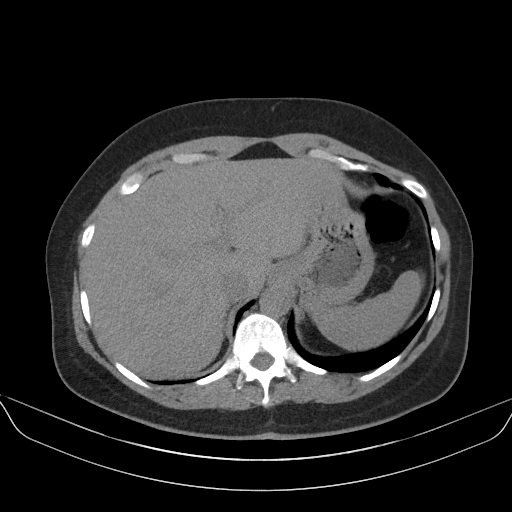
[im 80/95  lung]
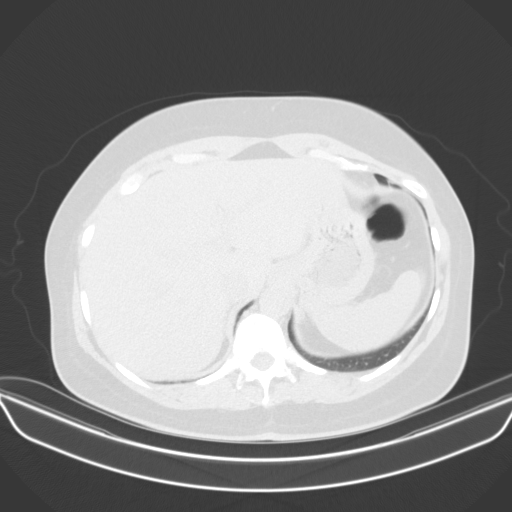
[im 85/95  lung]
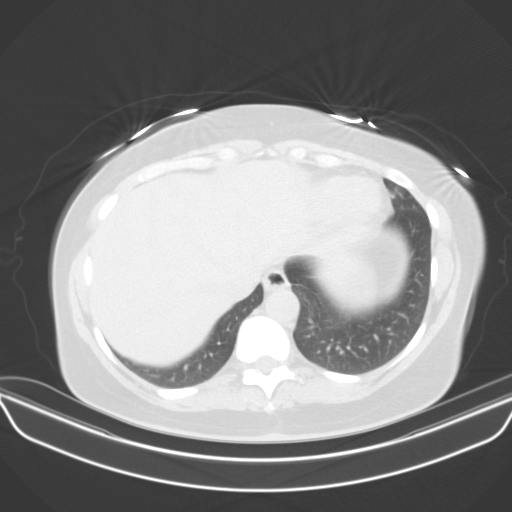
[im 90/95  soft-tissue]
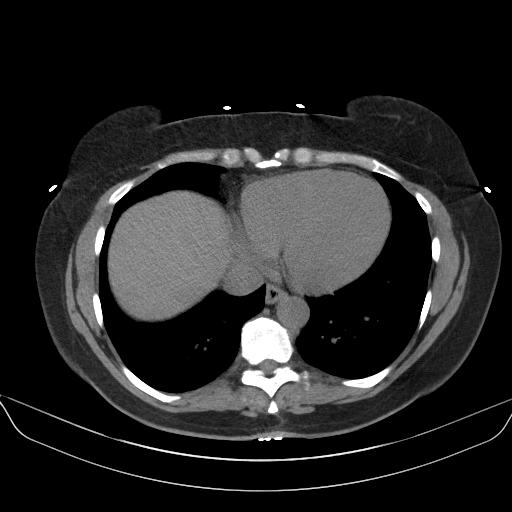
[im 90/95  lung]
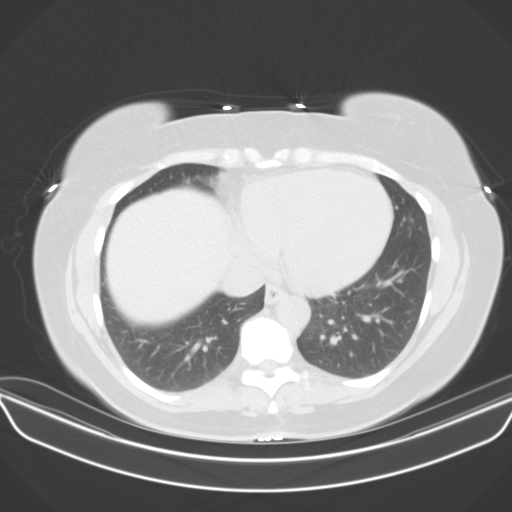

[14 of 32 positions shown; findings below may reference images not displayed]

FINDINGS: Lower chest: No acute abnormality.

Hepatobiliary: Large solitary gallstone is noted. No biliary
dilatation is noted. No hepatic abnormality is noted.

Pancreas: Unremarkable. No pancreatic ductal dilatation or
surrounding inflammatory changes.

Spleen: Normal in size without focal abnormality.

Adrenals/Urinary Tract: Adrenal glands are unremarkable. Kidneys are
normal, without renal calculi, focal lesion, or hydronephrosis.
Bladder is unremarkable.

Stomach/Bowel: The stomach appears normal. There is no evidence of
bowel obstruction or inflammation. The appendix is not visualized.
Stool is noted in the right colon.

Vascular/Lymphatic: No vascular abnormality is noted. Mild
retroperitoneal adenopathy is noted, but all lymph nodes are less
than 1 cm in size and therefore most likely reactive or benign in
etiology.

Reproductive: Uterus is unremarkable. No right adnexal abnormality
is noted. Bilobed cystic lesion measuring 10.3 x 8.1 cm is seen in
the left adnexal region.

Other: No abdominal wall hernia or abnormality. No abdominopelvic
ascites.

Musculoskeletal: No acute or significant osseous findings.
IMPRESSION: 10.3 x 8.1 cm bilobed cystic lesion is seen in left adnexal region
concerning for cystic ovarian neoplasm. Further evaluation with MRI
is recommended.

Large solitary gallstone is noted without cholecystitis.

## 2021-02-09 ENCOUNTER — Ambulatory Visit (INDEPENDENT_AMBULATORY_CARE_PROVIDER_SITE_OTHER): Payer: No Typology Code available for payment source | Admitting: Psychiatry

## 2021-02-09 ENCOUNTER — Other Ambulatory Visit: Payer: Self-pay

## 2021-02-09 DIAGNOSIS — F5089 Other specified eating disorder: Secondary | ICD-10-CM

## 2021-02-09 DIAGNOSIS — Z8659 Personal history of other mental and behavioral disorders: Secondary | ICD-10-CM

## 2021-02-09 DIAGNOSIS — F4010 Social phobia, unspecified: Secondary | ICD-10-CM

## 2021-02-09 DIAGNOSIS — F411 Generalized anxiety disorder: Secondary | ICD-10-CM | POA: Diagnosis not present

## 2021-02-09 DIAGNOSIS — Z636 Dependent relative needing care at home: Secondary | ICD-10-CM

## 2021-02-09 DIAGNOSIS — F1021 Alcohol dependence, in remission: Secondary | ICD-10-CM

## 2021-02-09 DIAGNOSIS — F3342 Major depressive disorder, recurrent, in full remission: Secondary | ICD-10-CM | POA: Diagnosis not present
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
## 2021-03-09 ENCOUNTER — Ambulatory Visit: Payer: No Typology Code available for payment source | Admitting: Psychiatry

## 2021-03-21 ENCOUNTER — Ambulatory Visit: Payer: No Typology Code available for payment source | Admitting: Physician Assistant

## 2021-04-18 ENCOUNTER — Other Ambulatory Visit: Payer: Self-pay | Admitting: Physician Assistant

## 2021-04-27 ENCOUNTER — Ambulatory Visit (INDEPENDENT_AMBULATORY_CARE_PROVIDER_SITE_OTHER): Payer: No Typology Code available for payment source | Admitting: Psychiatry

## 2021-04-27 ENCOUNTER — Other Ambulatory Visit: Payer: Self-pay

## 2021-04-27 DIAGNOSIS — F411 Generalized anxiety disorder: Secondary | ICD-10-CM

## 2021-04-27 DIAGNOSIS — F1021 Alcohol dependence, in remission: Secondary | ICD-10-CM | POA: Diagnosis not present

## 2021-04-27 DIAGNOSIS — F3341 Major depressive disorder, recurrent, in partial remission: Secondary | ICD-10-CM | POA: Diagnosis not present

## 2021-04-27 DIAGNOSIS — Z8659 Personal history of other mental and behavioral disorders: Secondary | ICD-10-CM | POA: Diagnosis not present
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
## 2021-04-29 ENCOUNTER — Encounter: Payer: Self-pay | Admitting: Physician Assistant

## 2021-04-29 ENCOUNTER — Ambulatory Visit: Payer: No Typology Code available for payment source | Admitting: Physician Assistant

## 2021-04-29 ENCOUNTER — Other Ambulatory Visit: Payer: Self-pay

## 2021-04-29 VITALS — BP 144/99 | HR 64

## 2021-04-29 DIAGNOSIS — F411 Generalized anxiety disorder: Secondary | ICD-10-CM | POA: Diagnosis not present

## 2021-04-29 DIAGNOSIS — F331 Major depressive disorder, recurrent, moderate: Secondary | ICD-10-CM | POA: Diagnosis not present

## 2021-04-29 DIAGNOSIS — R030 Elevated blood-pressure reading, without diagnosis of hypertension: Secondary | ICD-10-CM

## 2021-04-29 DIAGNOSIS — F4310 Post-traumatic stress disorder, unspecified: Secondary | ICD-10-CM

## 2021-04-29 MED ORDER — PRAZOSIN HCL 1 MG PO CAPS: 1.0000 mg | ORAL_CAPSULE | Freq: Every day | ORAL | 1 refills | Status: DC

## 2021-04-29 MED ORDER — BUPROPION HCL ER (XL) 150 MG PO TB24: 450.0000 mg | ORAL_TABLET | Freq: Every day | ORAL | 1 refills | Status: DC

## 2021-04-29 MED ORDER — VILAZODONE HCL 40 MG PO TABS: 40.0000 mg | ORAL_TABLET | Freq: Every day | ORAL | 1 refills | Status: DC

## 2021-04-29 NOTE — Progress Notes (Signed)
[facility]  Patient ID: Amanda Dillon,  MRN: [id number]  PCP: Donald Prose, MD  Date of Evaluation: 04/29/2021 Time spent:30 minutes  Chief Complaint:  Chief Complaint   Anxiety; Depression; Insomnia; Follow-up      HISTORY/CURRENT STATUS: HPI For routine med check.  Is tired a lot.  She is on call and pulling a lot of extra hours at work.  She is a Education officer, museum at [employer].  This time of year she sometimes gets kind of low.  Feels blue.  Is nervous that it will get worse.  Feels lethargic.  Not missing work.  Does have trouble sleeping and feeling rested in the mornings but some of it is probably due to taking call. Dreams odd things sometimes.  Personal hygiene is normal.  She is able to enjoy things. Does get anxious but triggered buy things when it occurs.  No suicidal or homicidal thoughts.  Patient denies increased energy with decreased need for sleep, no increased talkativeness, no racing thoughts, no impulsivity or risky behaviors, no increased spending, no increased libido, no grandiosity, no increased irritability or anger, and no hallucinations.  Denies dizziness, syncope, seizures, numbness, tingling, tremor, tics, unsteady gait, slurred speech, confusion. Denies muscle or joint pain, stiffness, or dystonia.  Individual Medical History/ Review of Systems: Changes? :No   Past medications for mental health diagnoses include: Uncertain.  She has taken a higher dose of Wellbutrin XL which did help but she has not needed that strength for a while.  Allergies: Patient has no known allergies.  Current Medications:  Current Outpatient Medications:    buPROPion (WELLBUTRIN XL) 150 MG 24 hr tablet, Take 3 tablets (450 mg total) by mouth daily., Disp: 270 tablet, Rfl: 1   estradiol (ESTRACE) 2 MG tablet, Take 1 tablet by mouth daily., Disp: , Rfl:    prazosin (MINIPRESS) 1 MG capsule, Take 1 capsule (1 mg total) by mouth at bedtime. Take with the 2 mg dose=3  mg qhs., Disp: 90 capsule, Rfl: 1   prazosin (MINIPRESS) 2 MG capsule, Take 1 capsule (2 mg total) by mouth at bedtime., Disp: 90 capsule, Rfl: 1   progesterone (PROMETRIUM) 100 MG capsule, Take 100 mg by mouth at bedtime., Disp: , Rfl:    senna-docusate (SENOKOT-S) 8.6-50 MG tablet, Take 2 tablets by mouth at bedtime. FOR AFTER SURGERY, DO NOT TAKE IF HAVING DIARRHEA (Patient not taking: Reported on 06/16/2019), Disp: 60 tablet, Rfl: 1   Vilazodone HCl (VIIBRYD) 40 MG TABS, Take 1 tablet (40 mg total) by mouth daily., Disp: 90 tablet, Rfl: 1 Medication Side Effects: none  Family Medical/ Social History: Changes?no  MENTAL HEALTH EXAM:  Blood pressure (!) 144/99, pulse 64.There is no height or weight on file to calculate BMI.  General Appearance: Casual, Well Groomed and Obese  Eye Contact:  Good  Speech:  Clear and Coherent and Normal Rate  Volume:  Normal  Mood:  Euthymic  Affect:  Appropriate  Thought Process:  Goal Directed and Descriptions of Associations: Circumstantial  Orientation:  Full (Time, Place, and Person)  Thought Content: Logical   Suicidal Thoughts:  No  Homicidal Thoughts:  No  Memory:  WNL  Judgement:  Good  Insight:  Good  Psychomotor Activity:  Normal  Concentration:  Concentration: Good  Recall:  Good  Fund of Knowledge: Good  Language: Good  Assets:  Desire for Improvement  ADL's:  Intact  Cognition: WNL  Prognosis:  Good    DIAGNOSES:    ICD-10-CM  1. Major depressive disorder, recurrent episode, moderate (HCC)  F33.1     2. PTSD (post-traumatic stress disorder)  F43.10     3. Generalized anxiety disorder -- managed  F41.1     4. White coat syndrome without hypertension  R03.0        Receiving Psychotherapy: Yes With Dr. Luan Moore   RECOMMENDATIONS:  PDMP was reviewed.  No controlled substances filled since 03/05/2020, small quantity of oxycodone. I provided 30  minutes of face to face time during this encounter, including time spent  before and after the visit in records review, medical decision making, counseling pertinent to today's visit, and charting.  Recommend increasing the Wellbutrin to help treat and prevent worsening of the depression.  Also recommend increasing the prazosin as she is at a fairly low-dose at present and prevent disturbing dreams. She has a physical with her PCP next week and will discuss lab work with them.  Recommend vitamin D level to be drawn.  Also to discuss suspected white coat hypertension. Mood therapy lamp sit beside it 20 to 30 minutes daily. Discussed sleep hygiene.  Amber color blue blocker glasses if she is going to be on her phone or tablet within 2 hours of the time that she needs to go to sleep. Increase Wellbutrin XL to a total of 450 mg daily. Increase prazosin to 3 mg p.o. nightly. Continue Viibryd 40 mg, 1 p.o. daily. Continue therapy with Dr. Luan Moore. Return in 4 to 6 weeks.  Donnal Moat, PA-C

## 2021-06-08 ENCOUNTER — Ambulatory Visit (INDEPENDENT_AMBULATORY_CARE_PROVIDER_SITE_OTHER): Payer: No Typology Code available for payment source | Admitting: Psychiatry

## 2021-06-08 ENCOUNTER — Other Ambulatory Visit: Payer: Self-pay

## 2021-06-08 DIAGNOSIS — F1021 Alcohol dependence, in remission: Secondary | ICD-10-CM | POA: Diagnosis not present

## 2021-06-08 DIAGNOSIS — F411 Generalized anxiety disorder: Secondary | ICD-10-CM | POA: Diagnosis not present

## 2021-06-08 DIAGNOSIS — Z8659 Personal history of other mental and behavioral disorders: Secondary | ICD-10-CM

## 2021-06-08 DIAGNOSIS — Z636 Dependent relative needing care at home: Secondary | ICD-10-CM

## 2021-06-08 DIAGNOSIS — F3342 Major depressive disorder, recurrent, in full remission: Secondary | ICD-10-CM

## 2021-06-08 DIAGNOSIS — F4010 Social phobia, unspecified: Secondary | ICD-10-CM | POA: Diagnosis not present

## 2021-06-08 NOTE — Progress Notes (Signed)
Psychotherapy Progress Note [facility] Amanda Moore, PhD LP  Patient ID: Amanda Dillon [facility])    MRN: [id number] Therapy format: Individual psychotherapy Date: 06/08/2021      Start: 5:07p     Stop: 5:56p     Time Spent: 49 min Location: In-person   Session narrative (presenting needs, interim history, self-report of stressors and symptoms, applications of prior therapy, status changes, and interventions made in session) Good to get away to [place], see parents.  Pleased with her own efforts to keep up with family, glad to get some time off, more apt to do that regularly.  Good to connect with sister Amanda Dillon, who had 5 kids and considerable distractions but quality time.   Busy time for work -- 18 annual reviews to do.  Feels comfortable with her role and how she handles guidance and discipline.  Difficulties come from another manager who stirs the pot, but she will be retiring soon.  Own boss seems somewhat timid with guidance and feedback.  Encouraged in keeping her own counsel with lack of information, evaluate herself as she would her own 24.  Set for a raise.  Good discovery, prompted by psychiatry, that her vitamin D was low.  Supplementing now.  May try audiology this year, and a hearing aid if needed.  Looking to keep getting out more this year, stretching her boundaries to circulate in the world and then try meeting people more.  Will be going to plays, alone, then look further into social groups, e.g., a running club.    Troubleshot a few doubts about supporting Amanda Dillon, who remains persistently depressed and has at times revealed active SI.  Will be hosting her for birthday, with a deal to bring her kids.  Intent on providing an unmistakably caring experience, and if needed, addressing her kids more clearly about the grinding conflict and relationship tensions that so provoke Amanda Dillon.  Therapeutic modalities: Cognitive Behavioral Therapy and Solution-Oriented/Positive  Psychology  Mental Status/Observations:  Appearance:   Casual     Behavior:  Appropriate  Motor:  Normal  Speech/Language:   Clear and Coherent  Affect:  Appropriate  Mood:  Mostly copacetic  Thought process:  normal  Thought content:    WNL  Sensory/Perceptual disturbances:    WNL  Orientation:  Fully oriented  Attention:  Good    Concentration:  Good  Memory:  WNL  Insight:    Good  Judgment:   Good  Impulse Control:  Good   Risk Assessment: Danger to Self: No Self-injurious Behavior: No Danger to Others: No Physical Aggression / Violence: No Duty to Warn: No Access to Firearms a concern: No  Assessment of progress:  progressing well  Diagnosis:   ICD-10-CM   1. Generalized anxiety disorder -- managed  F41.1     2. Recurrent major depressive disorder, in full remission ([place])  F33.42     3. Social anxiety disorder  F40.10     4. Alcohol dependence in longterm remission ([place])  F10.21     5. History of posttraumatic stress disorder (PTSD)  Z86.59     6. 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 Physically -- keep up with vitamin issues as noted, improve exercise habits, continue restraining carbs, recommend following through on audiology Caregiver issues -- maintain balanced approach, willing to ask challenging questions if needed, willing to say assertively  if needs to act in her best interest without permission Sobriety -- continue AA, sponsorship  Other recommendations/advice as may be noted above Continue to utilize previously learned skills ad lib Maintain medication as prescribed and work faithfully with relevant prescriber(s) if any changes are desired or seem indicated Call the clinic on-call service, 988/hotline, 911, or present to [facility] or ER if any life-threatening psychiatric crisis Return in about 1 month (around 07/09/2021) for time at discretion. Already  scheduled visit in this office 06/14/2021.  Amanda Serve, PhD Amanda Moore, PhD LP Clinical Psychologist, [facility] [address], [place], [place] [address] [phone]

## 2021-06-14 ENCOUNTER — Ambulatory Visit: Payer: No Typology Code available for payment source | Admitting: Physician Assistant

## 2021-06-29 ENCOUNTER — Ambulatory Visit: Payer: No Typology Code available for payment source | Admitting: Psychiatry

## 2021-07-06 ENCOUNTER — Other Ambulatory Visit: Payer: Self-pay

## 2021-07-06 ENCOUNTER — Ambulatory Visit (INDEPENDENT_AMBULATORY_CARE_PROVIDER_SITE_OTHER): Payer: No Typology Code available for payment source | Admitting: Psychiatry

## 2021-07-06 DIAGNOSIS — Z724 Inappropriate diet and eating habits: Secondary | ICD-10-CM

## 2021-07-06 DIAGNOSIS — Z636 Dependent relative needing care at home: Secondary | ICD-10-CM

## 2021-07-06 DIAGNOSIS — F1021 Alcohol dependence, in remission: Secondary | ICD-10-CM

## 2021-07-06 DIAGNOSIS — F411 Generalized anxiety disorder: Secondary | ICD-10-CM | POA: Diagnosis not present

## 2021-07-06 DIAGNOSIS — F3341 Major depressive disorder, recurrent, in partial remission: Secondary | ICD-10-CM

## 2021-07-06 DIAGNOSIS — Z8659 Personal history of other mental and behavioral disorders: Secondary | ICD-10-CM | POA: Diagnosis not present
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
## 2021-07-15 ENCOUNTER — Other Ambulatory Visit: Payer: Self-pay

## 2021-07-15 ENCOUNTER — Ambulatory Visit: Payer: No Typology Code available for payment source | Admitting: Physician Assistant

## 2021-07-15 ENCOUNTER — Encounter: Payer: Self-pay | Admitting: Physician Assistant

## 2021-07-15 VITALS — BP 136/85 | HR 67

## 2021-07-15 DIAGNOSIS — F3341 Major depressive disorder, recurrent, in partial remission: Secondary | ICD-10-CM | POA: Diagnosis not present

## 2021-07-15 DIAGNOSIS — F4310 Post-traumatic stress disorder, unspecified: Secondary | ICD-10-CM

## 2021-07-15 DIAGNOSIS — F518 Other sleep disorders not due to a substance or known physiological condition: Secondary | ICD-10-CM | POA: Diagnosis not present

## 2021-07-15 DIAGNOSIS — F411 Generalized anxiety disorder: Secondary | ICD-10-CM

## 2021-07-15 MED ORDER — PRAZOSIN HCL 2 MG PO CAPS: 4.0000 mg | ORAL_CAPSULE | Freq: Every day | ORAL | 1 refills | Status: DC

## 2021-07-15 NOTE — Progress Notes (Signed)
[facility]  Patient ID: Amanda Dillon,  MRN: [id number]  PCP: Donald Prose, MD  Date of Evaluation: 07/15/2021 Time spent:20 minutes  Chief Complaint:  Chief Complaint   Depression; Follow-up     HISTORY/CURRENT STATUS: HPI For routine med check.  Having more dreams, disturbing, vivid. Affects quality of rest. Is getting more hours of sleep now, not on call like she had been in the fall and early winter. Triggered anxiety, she discusses with Dr. Rica Mote in session so doesn't 'want to go into it here.' Doesn't cause 'much of a problem' with work and home life.   Feels like meds are working other than noted. Is able to enjoy some things.  Energy and motivation are fair to good, just depends on the day.  Eating more carbs. See note per Dr, Rica Mote, she doesn't bring it up today. No extreme sadness, tearfulness, or feelings of hopelessness.  Denies any changes in concentration, making decisions or remembering things. ADLs and personal hygiene are fair or good also depends on the day and how tired she is, either physically or mentally. Denies suicidal or homicidal thoughts.  Patient denies increased energy with decreased need for sleep, no increased talkativeness, no racing thoughts, no impulsivity or risky behaviors, no increased spending, no increased libido, no grandiosity, no increased irritability or anger, and no hallucinations.  Denies dizziness, syncope, seizures, numbness, tingling, tremor, tics, unsteady gait, slurred speech, confusion. Denies muscle or joint pain, stiffness, or dystonia.  Individual Medical History/ Review of Systems: Changes? :No   Past medications for mental health diagnoses include: Uncertain.  She has taken a higher dose of Wellbutrin XL which did help but she has not needed that strength for a while.  Allergies: Patient has no known allergies.  Current Medications:  Current Outpatient Medications:    buPROPion (WELLBUTRIN XL) 150 MG 24 hr  tablet, Take 3 tablets (450 mg total) by mouth daily., Disp: 270 tablet, Rfl: 1   estradiol (ESTRACE) 2 MG tablet, Take 1 tablet by mouth daily., Disp: , Rfl:    progesterone (PROMETRIUM) 100 MG capsule, Take 100 mg by mouth at bedtime., Disp: , Rfl:    Vilazodone HCl (VIIBRYD) 40 MG TABS, Take 1 tablet (40 mg total) by mouth daily., Disp: 90 tablet, Rfl: 1   Vitamin D, Ergocalciferol, (DRISDOL) 1.25 MG (50000 UNIT) CAPS capsule, Take 50,000 Units by mouth every 7 (seven) days., Disp: , Rfl:    prazosin (MINIPRESS) 2 MG capsule, Take 2 capsules (4 mg total) by mouth at bedtime., Disp: 180 capsule, Rfl: 1 Medication Side Effects: none  Family Medical/ Social History: Changes?no  MENTAL HEALTH EXAM:  Blood pressure 136/85, pulse 67.There is no height or weight on file to calculate BMI.  General Appearance: Casual, Well Groomed and Obese  Eye Contact:  Good  Speech:  Clear and Coherent and Normal Rate  Volume:  Normal  Mood:  Euthymic  Affect:  Appropriate  Thought Process:  Goal Directed and Descriptions of Associations: Circumstantial  Orientation:  Full (Time, Place, and Person)  Thought Content: Logical   Suicidal Thoughts:  No  Homicidal Thoughts:  No  Memory:  WNL  Judgement:  Good  Insight:  Good  Psychomotor Activity:  Normal  Concentration:  Concentration: Good  Recall:  Good  Fund of Knowledge: Good  Language: Good  Assets:  Desire for Improvement  ADL's:  Intact  Cognition: WNL  Prognosis:  Good    DIAGNOSES:    ICD-10-CM   1. PTSD (post-traumatic  stress disorder)  F43.10     2. Abnormal dreams  F51.8     3. Generalized anxiety disorder -- managed  F41.1     4. Depression, major, recurrent, in partial remission ([place])  F33.41       Receiving Psychotherapy: Yes With Dr. Luan Moore   RECOMMENDATIONS:  PDMP was reviewed.  No controlled substances filled since 03/05/2020, small quantity of oxycodone. I provided 20 minutes of face to face time during this  encounter, including time spent before and after the visit in records review, medical decision making, counseling pertinent to today's visit, and charting.   Discussed sleep/dreams. She has tolerated Prazosin well and currently having no SE. Recommend increasing the dose. She'd like to try it. She knows what to watch for, dizziness, near syncope, decrease dose. Call if needed.    Continue Wellbutrin XL 450 mg daily. Increase prazosin to 4 mg p.o. nightly. Continue Viibryd 40 mg, 1 p.o. daily. Continue therapy with Dr. Luan Moore. Return in 2 months.  Donnal Moat, PA-C

## 2021-09-07 ENCOUNTER — Ambulatory Visit (INDEPENDENT_AMBULATORY_CARE_PROVIDER_SITE_OTHER): Payer: No Typology Code available for payment source | Admitting: Psychiatry

## 2021-09-07 DIAGNOSIS — Z8659 Personal history of other mental and behavioral disorders: Secondary | ICD-10-CM

## 2021-09-07 DIAGNOSIS — F411 Generalized anxiety disorder: Secondary | ICD-10-CM | POA: Diagnosis not present

## 2021-09-07 DIAGNOSIS — F5089 Other specified eating disorder: Secondary | ICD-10-CM | POA: Diagnosis not present

## 2021-09-07 DIAGNOSIS — F1011 Alcohol abuse, in remission: Secondary | ICD-10-CM

## 2021-09-07 DIAGNOSIS — F3341 Major depressive disorder, recurrent, in partial remission: Secondary | ICD-10-CM | POA: Diagnosis not present

## 2021-09-07 NOTE — Progress Notes (Signed)
Psychotherapy Progress Note ?[facility] ?Luan Moore, PhD LP ? ?Patient ID: Amanda Dillon [facility])    MRN: [id number] ?Therapy format: Individual psychotherapy ?Date: 09/07/2021      Start: 6:10p     Stop: 7:00p     Time Spent: 50 min ?Location: In-person  ? ?Session narrative (presenting needs, interim history, self-report of stressors and symptoms, applications of prior therapy, status changes, and interventions made in session) ?Concern right now that she will be going to a conference and she is already finding she may want to drink.  Has been indulging sugar again.  Is afraid of relapsing in depression, as that, too felt traumatic to her.  Feels generic vilazodone is not as effective, but her sleep quality is down, too.  Acknowledges some NMs, and feeling more or less hung over in the morning.  Will wake kicking, e.g., but not remember what it's about.  Admits she is having frequent dreams of [name], often angry, over feeling used by him.  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   ? ?Validated again her disappointment, confirmed no impulse to go [employer] a decade later and how this whole episode was triggered by seeing him in person from a distance for the first time in years.  Roleplayed a hypothetical conversation with him to release attachment to him and practice voicing what she needed to.  Reframed her task right now as forgiveness, much more for herself than him.  Framed how self-esteem as a lover is essentially stuck for not having other experiences to go by since the whole [place] episode and the way she lost herself in quasi-borderline responses and framed hope from either living or imagining better in her own reactions.  Advised consult psychiatry about raising alpha blocker to reduce traumatic  post-traumatic pressure to panic, NM, and relapse. ? ?Therapeutic modalities: Cognitive Behavioral Therapy, Solution-Oriented/Positive Psychology, and Gestalt/Psychodrama ? ?Mental Status/Observations: ? ?Appearance:   Casual     ?Behavior:  Appropriate  ?Motor:  Normal  ?Speech/Language:   Clear and Coherent  ?Affect:  Appropriate  ?Mood:  anxious  ?Thought process:  normal  ?Thought content:    WNL, worry  ?Sensory/Perceptual disturbances:    WNL  ?Orientation:  Fully oriented  ?Attention:  Good  ?  ?Concentration:  Good  ?Memory:  WNL  ?Insight:    Good  ?Judgment:   Good  ?Impulse Control:  Good  ? ?Risk Assessment: ?Danger to Self: No Self-injurious Behavior: No ?Danger to Others: No Physical Aggression / Violence: No ?Duty to Warn: No Access to Firearms a concern: No ? ?Assessment of progress:  situational setback(s) ? ?Diagnosis: ?  ICD-10-CM   ?1. Generalized anxiety disorder  F41.1   ?  ?2. History of posttraumatic stress disorder (PTSD)  Z86.59   ?  ?3. Recurrent major depressive disorder, in partial remission ([place])  F33.41   ?  ?4. Other disorder of eating - carbohydrate addiction -- in remission  F50.89   ?  ?5. History of alcohol abuse  F10.11   ?  ? ?Plan:  ?Consult psychiatry -- probably another prazosin increase, rather than address generic Viibryd.  With short timeline, will message psychiatry for her ?Remain alcohol abstinent ?Replay and develop imaginal interaction with [name] ?Inventory points of self-forgiveness ?Other recommendations/advice as may be noted  above ?Continue to utilize previously learned skills ad lib ?Maintain medication as prescribed and work faithfully with relevant prescriber(s) if any changes are desired or seem indicated ?Call the clinic on-call service, 988/hotline, 911, or present to [facility] or ER if any life-threatening psychiatric crisis ?Return in about 1 month (around 10/07/2021) for time as available. ?Already scheduled visit in this office 09/13/2021. ? ?[name],  PhD ?Luan Moore, PhD LP ?Clinical Psychologist, [facility] ?[facility] ?[address] ?[place], [place] [address] ?([phone]) [phone] ?

## 2021-09-08 ENCOUNTER — Other Ambulatory Visit: Payer: Self-pay | Admitting: Physician Assistant

## 2021-09-08 ENCOUNTER — Telehealth: Payer: Self-pay | Admitting: Physician Assistant

## 2021-09-08 MED ORDER — PRAZOSIN HCL 5 MG PO CAPS: 5.0000 mg | ORAL_CAPSULE | Freq: Every day | ORAL | 0 refills | Status: DC

## 2021-09-08 NOTE — Telephone Encounter (Signed)
LVM to RC 

## 2021-09-08 NOTE — Telephone Encounter (Signed)
Dr. Rica Mote talked with me about Brigett's nightmares.  Recommend increasing the prazosin to 5 mg p.o. q. evening.  I will send in a prescription, please confirm her pharmacy.  Watch for dizziness or feeling faint, remind her of orthostatic hypotension symptoms and be careful when standing up from a sitting or lying down position. ?Thank you. ?

## 2021-09-08 NOTE — Telephone Encounter (Signed)
Prescription sent

## 2021-09-08 NOTE — Telephone Encounter (Signed)
[facility] ? ?Patient notified of recommendations.  ?

## 2021-09-13 ENCOUNTER — Ambulatory Visit (INDEPENDENT_AMBULATORY_CARE_PROVIDER_SITE_OTHER): Payer: No Typology Code available for payment source | Admitting: Physician Assistant

## 2021-09-13 ENCOUNTER — Encounter: Payer: Self-pay | Admitting: Physician Assistant

## 2021-09-13 VITALS — BP 138/83 | HR 67

## 2021-09-13 DIAGNOSIS — F4310 Post-traumatic stress disorder, unspecified: Secondary | ICD-10-CM

## 2021-09-13 DIAGNOSIS — F331 Major depressive disorder, recurrent, moderate: Secondary | ICD-10-CM

## 2021-09-13 DIAGNOSIS — F1011 Alcohol abuse, in remission: Secondary | ICD-10-CM

## 2021-09-13 DIAGNOSIS — F411 Generalized anxiety disorder: Secondary | ICD-10-CM | POA: Diagnosis not present

## 2021-09-13 DIAGNOSIS — F515 Nightmare disorder: Secondary | ICD-10-CM

## 2021-09-13 MED ORDER — VIIBRYD 40 MG PO TABS: 40.0000 mg | ORAL_TABLET | Freq: Every day | ORAL | 1 refills | Status: DC

## 2021-09-13 MED ORDER — ARIPIPRAZOLE 2 MG PO TABS: 2.0000 mg | ORAL_TABLET | Freq: Every morning | ORAL | 1 refills | Status: DC

## 2021-09-13 NOTE — Addendum Note (Signed)
Addended by: Donnal Moat T on: 09/13/2021 05:29 PM ? ? Modules accepted: Orders ? ?

## 2021-09-13 NOTE — Progress Notes (Addendum)
[facility] ? ?Patient ID: Amanda Dillon,  ?MRN: [id number] ? ?PCP: Donald Prose, MD ? ?Date of Evaluation: 09/13/2021 ?Time spent:40 minutes ? ?Chief Complaint:  ?Chief Complaint   ?Anxiety; Depression; Follow-up ?  ? ? ?HISTORY/CURRENT STATUS: ?HPI For routine med check. ? ?Depression is a little worse. For about 3-4 weeks.  Has been given Viibryd generic approximately 2 months ago and wonders if that might be part of the problem.  Does not have a lot of energy or motivation although it is not affecting her work.  ADLs and personal hygiene are normal.  She is using food as comfort.  Not crying easily.  Denies suicidal or homicidal thoughts. ? ?She went out of town this past weekend and for the first time ever she thought about drinking although she did not.  She has been sober for 12 years.  She did discuss this with Dr. Rica Mote as well as her [place] sponsor and they were helpful.  States she is not having cravings and knows drinking would just make the depression even worse.   ? ?Has been experiencing more nightmares.  The prazosin was increased 4 nights ago to 5 mg.  She is still having nightmares but feels that she is resting a little bit better, at least sleeping through the night for the most part.  Feels tired in the mornings because her mind seems to be working all night long.  Anxiety is definitely a problem in part due to lack of restorative sleep, probably depression as well. ? ?Patient denies increased energy with decreased need for sleep, no increased talkativeness, no racing thoughts, no impulsivity or risky behaviors, no increased spending, no increased libido, no grandiosity, no increased irritability or anger, and no hallucinations. ? ?Denies dizziness, syncope, seizures, numbness, tingling, tremor, tics, unsteady gait, slurred speech, confusion. Denies muscle or joint pain, stiffness, or dystonia. ? ?Individual Medical History/ Review of Systems: Changes? :No  ? ?Past medications for mental  health diagnoses include: ?Uncertain.  She has taken a higher dose of Wellbutrin XL which did help but she has not needed that strength for a while.  ? ?Allergies: Patient has no known allergies. ? ?Current Medications:  ?Current Outpatient Medications:  ?  ARIPiprazole (ABILIFY) 2 MG tablet, Take 1 tablet (2 mg total) by mouth in the morning., Disp: 30 tablet, Rfl: 1 ?  buPROPion (WELLBUTRIN XL) 150 MG 24 hr tablet, Take 3 tablets (450 mg total) by mouth daily., Disp: 270 tablet, Rfl: 1 ?  estradiol (ESTRACE) 2 MG tablet, Take 1 tablet by mouth daily., Disp: , Rfl:  ?  prazosin (MINIPRESS) 5 MG capsule, Take 1 capsule (5 mg total) by mouth at bedtime., Disp: 90 capsule, Rfl: 0 ?  progesterone (PROMETRIUM) 100 MG capsule, Take 100 mg by mouth at bedtime., Disp: , Rfl:  ?  Vilazodone HCl (VIIBRYD) 40 MG TABS, Take 1 tablet (40 mg total) by mouth daily., Disp: 90 tablet, Rfl: 1 ?  Vitamin D, Ergocalciferol, (DRISDOL) 1.25 MG (50000 UNIT) CAPS capsule, Take 50,000 Units by mouth every 7 (seven) days., Disp: , Rfl:  ?Medication Side Effects: none ? ?Family Medical/ Social History: Changes?no ? ?MENTAL HEALTH EXAM: ? ?Blood pressure 138/83, pulse 67.There is no height or weight on file to calculate BMI.  ?General Appearance: Casual, Well Groomed and Obese  ?Eye Contact:  Good  ?Speech:  Clear and Coherent and Normal Rate  ?Volume:  Normal  ?Mood:  Depressed  ?Affect:  Depressed  ?Thought Process:  Goal  Directed and Descriptions of Associations: Circumstantial  ?Orientation:  Full (Time, Place, and Person)  ?Thought Content: Logical   ?Suicidal Thoughts:  No  ?Homicidal Thoughts:  No  ?Memory:  WNL  ?Judgement:  Good  ?Insight:  Good  ?Psychomotor Activity:  Normal  ?Concentration:  Concentration: Good  ?Recall:  Good  ?Fund of Knowledge: Good  ?Language: Good  ?Assets:  Desire for Improvement  ?ADL's:  Intact  ?Cognition: WNL  ?Prognosis:  Good  ? ?Labs through [facility] 05/06/2021, lipid panel normal except total  cholesterol 232, LDL 147.  Glucose 74. see Care Everywhere. ? ?DIAGNOSES:  ?  ICD-10-CM   ?1. Major depressive disorder, recurrent episode, moderate (HCC)  F33.1   ?  ?2. Generalized anxiety disorder  F41.1   ?  ?3. PTSD (post-traumatic stress disorder)  F43.10   ?  ?4. History of alcohol abuse  F10.11   ?  ?5. Nightmares  F51.5   ?  ? ? ?Receiving Psychotherapy: Yes With Dr. Jonni Sanger Mitchum ? ? ?RECOMMENDATIONS:  ?PDMP was reviewed.  No controlled substances filled since 03/05/2020, small quantity of oxycodone. ?I provided 40 minutes of face to face time during this encounter, including time spent before and after the visit in records review, medical decision making, counseling pertinent to today's visit, and charting.  ?We discussed the depression.  It is possible that the generic Viibryd is not as effective as the brand name. I will discuss this with Dory Larsen, LPN who does all the prior authorizations and see if it will be possible to get the brand name.   ?Discussed other options for the depression, Abilify, Rexulti, or Vraylar would be a good adjunct meds.  We discussed the benefits, risks, possible side effects of weight gain and metabolic syndrome.  She understands and would like to try 1 of those.  Recommend Abilify as it is generic.  If she starts gaining weight then she will let me know in a few weeks and may need to change to one of the others.  Or Anette Guarneri might be another good alternative.  Also briefly discussed lithium as an adjunct treatment for major depressive disorder.  We agree to go with Abilify for now. ?Discussed the thought of drinking alcohol again.  At this point she does not feel the need to add on a medication for prevention but we did discuss Accomprosate and naltrexone.  If she feels the craving for alcohol or that she will be tempted, she can call and I will send in a acamprosate.  ?For the nightmares I recommend increasing the prazosin even further.  She is tolerating it well  without dizziness or feeling faint but will let me know if those things occur. ? ?Start Abilify 2 mg, 1 p.o. every morning. ?Continue Wellbutrin XL 450 mg daily. ?Increase prazosin to 6 mg p.o. nightly. ?Continue Viibryd 40 mg, 1 p.o. daily. ?Continue therapy with Dr. Luan Moore. ?Return in 1 month. ? ?Donnal Moat, PA-C  ?

## 2021-10-13 ENCOUNTER — Encounter: Payer: Self-pay | Admitting: Physician Assistant

## 2021-10-13 ENCOUNTER — Ambulatory Visit (INDEPENDENT_AMBULATORY_CARE_PROVIDER_SITE_OTHER): Payer: No Typology Code available for payment source | Admitting: Physician Assistant

## 2021-10-13 VITALS — BP 137/83 | HR 62

## 2021-10-13 DIAGNOSIS — F3341 Major depressive disorder, recurrent, in partial remission: Secondary | ICD-10-CM | POA: Diagnosis not present

## 2021-10-13 DIAGNOSIS — F4310 Post-traumatic stress disorder, unspecified: Secondary | ICD-10-CM

## 2021-10-13 DIAGNOSIS — F411 Generalized anxiety disorder: Secondary | ICD-10-CM

## 2021-10-13 DIAGNOSIS — F1011 Alcohol abuse, in remission: Secondary | ICD-10-CM

## 2021-10-13 DIAGNOSIS — F515 Nightmare disorder: Secondary | ICD-10-CM | POA: Diagnosis not present

## 2021-10-13 MED ORDER — ARIPIPRAZOLE 2 MG PO TABS: 2.0000 mg | ORAL_TABLET | Freq: Every morning | ORAL | 1 refills | Status: DC

## 2021-10-13 MED ORDER — BUPROPION HCL ER (XL) 150 MG PO TB24: 450.0000 mg | ORAL_TABLET | Freq: Every day | ORAL | 1 refills | Status: DC

## 2021-10-13 NOTE — Progress Notes (Signed)
[facility]  Patient ID: Amanda Dillon,  MRN: [id number]  PCP: Donald Prose, MD  Date of Evaluation: 10/13/2021 Time spent:30 minutes  Chief Complaint:  Chief Complaint   Anxiety; Depression; Follow-up     HISTORY/CURRENT STATUS: HPI For routine med check.  At the last visit a month ago, Abilify was started and prazosin was increased because of nightmares.  States she feels better all around.  Is less depressed, able to enjoy things more.  Energy and motivation are better as well.  She is sleeping better and not having as many disturbing dreams.  Not as anxious.  Appetite is normal and weight is stable.  Not crying easily.  Remains sober.  ADLs and personal hygiene are normal.  No suicidal or homicidal thoughts.  At the last visit we discussed whether generic Viibryd may be working less than the brand name did.  It is hard to say and at this point she is doing okay, taking the same as she was taking at the last appt.  Patient denies increased energy with decreased need for sleep, no increased talkativeness, no racing thoughts, no impulsivity or risky behaviors, no increased spending, no increased libido, no grandiosity, no increased irritability or anger, no paranoia, and no hallucinations.  Denies dizziness, syncope, seizures, numbness, tingling, tremor, tics, unsteady gait, slurred speech, confusion. Denies muscle or joint pain, stiffness, or dystonia. Denies unexplained weight loss, frequent infections, or sores that heal slowly.  No polyphagia, polydipsia, or polyuria. Denies visual changes or paresthesias.   Individual Medical History/ Review of Systems: Changes? :No   Past medications for mental health diagnoses include: Uncertain.  She has taken a higher dose of Wellbutrin XL which did help but she has not needed that strength for a while.   Allergies: Patient has no known allergies.  Current Medications:  Current Outpatient Medications:    estradiol (ESTRACE) 2  MG tablet, Take 1 tablet by mouth daily., Disp: , Rfl:    prazosin (MINIPRESS) 5 MG capsule, Take 1 capsule (5 mg total) by mouth at bedtime. (Patient taking differently: Take 6 mg by mouth at bedtime.), Disp: 90 capsule, Rfl: 0   progesterone (PROMETRIUM) 100 MG capsule, Take 100 mg by mouth at bedtime., Disp: , Rfl:    VIIBRYD 40 MG TABS, Take 1 tablet (40 mg total) by mouth daily., Disp: 90 tablet, Rfl: 1   Vitamin D, Ergocalciferol, (DRISDOL) 1.25 MG (50000 UNIT) CAPS capsule, Take 50,000 Units by mouth every 7 (seven) days., Disp: , Rfl:    ARIPiprazole (ABILIFY) 2 MG tablet, Take 1 tablet (2 mg total) by mouth in the morning., Disp: 90 tablet, Rfl: 1   buPROPion (WELLBUTRIN XL) 150 MG 24 hr tablet, Take 3 tablets (450 mg total) by mouth daily., Disp: 270 tablet, Rfl: 1 Medication Side Effects: none  Family Medical/ Social History: Changes?no  MENTAL HEALTH EXAM:  Blood pressure 137/83, pulse 62.There is no height or weight on file to calculate BMI.  General Appearance: Casual, Well Groomed and Obese  Eye Contact:  Good  Speech:  Clear and Coherent and Normal Rate  Volume:  Normal  Mood:  Euthymic  Affect:  Congruent  Thought Process:  Goal Directed and Descriptions of Associations: Circumstantial  Orientation:  Full (Time, Place, and Person)  Thought Content: Logical   Suicidal Thoughts:  No  Homicidal Thoughts:  No  Memory:  WNL  Judgement:  Good  Insight:  Good  Psychomotor Activity:  Normal  Concentration:  Concentration: Good  Recall:  Good  Fund of Knowledge: Good  Language: Good  Assets:  Desire for Improvement  ADL's:  Intact  Cognition: WNL  Prognosis:  Good   Labs through [facility] 05/06/2021, lipid panel normal except total cholesterol 232, LDL 147.  Glucose 74. see Care Everywhere. Discussed with the patient.    DIAGNOSES:    ICD-10-CM   1. PTSD (post-traumatic stress disorder)  F43.10     2. Nightmares  F51.5     3. Depression, major, recurrent,  in partial remission ([place])  F33.41     4. Generalized anxiety disorder  F41.1     5. History of alcohol abuse  F10.11        Receiving Psychotherapy: Yes With Dr. Luan Moore   RECOMMENDATIONS:  PDMP was reviewed.  No controlled substances filled since 03/05/2020, small quantity of oxycodone. I provided 30 minutes of face to face time during this encounter, including time spent before and after the visit in records review, medical decision making, counseling pertinent to today's visit, and charting.  Again discussed the Viibryd.  If the depression worsens at any point then I will ask about brand name Viibryd, whether we can get insurance to pay for the brand name.  But for now we agree that it is not necessary.  Continue Abilify 2 mg, 1 p.o. every morning. Continue Wellbutrin XL 450 mg daily. Continue prazosin  6 mg p.o. nightly. Continue Viibryd 40 mg, 1 p.o. daily. Continue vitamin D 50,000 IUs, 1 p.o. weekly. Continue therapy with Dr. Luan Moore. Return in 3 months.  Donnal Moat, PA-C

## 2021-11-02 ENCOUNTER — Ambulatory Visit (INDEPENDENT_AMBULATORY_CARE_PROVIDER_SITE_OTHER): Payer: No Typology Code available for payment source | Admitting: Psychiatry

## 2021-11-02 DIAGNOSIS — F4010 Social phobia, unspecified: Secondary | ICD-10-CM

## 2021-11-02 DIAGNOSIS — F1021 Alcohol dependence, in remission: Secondary | ICD-10-CM

## 2021-11-02 DIAGNOSIS — F411 Generalized anxiety disorder: Secondary | ICD-10-CM | POA: Diagnosis not present

## 2021-11-02 DIAGNOSIS — F3341 Major depressive disorder, recurrent, in partial remission: Secondary | ICD-10-CM | POA: Diagnosis not present

## 2021-11-02 DIAGNOSIS — Z8659 Personal history of other mental and behavioral disorders: Secondary | ICD-10-CM

## 2021-11-02 NOTE — Progress Notes (Signed)
Psychotherapy Progress Note [facility] Luan Moore, PhD LP  Patient ID: Amanda Dillon [facility])    MRN: [id number] Therapy format: Individual psychotherapy Date: 11/02/2021      Start: 5:04p     Stop: 5:50p     Time Spent: 46 min Location: In-person   Session narrative (presenting needs, interim history, self-report of stressors and symptoms, applications of prior therapy, status changes, and interventions made in session) Successfully kept sober going to the case management conference, with good help of sponsor and a meeting.  Turned out to be fairly easy to just watch others be stupid for an hour, then make her exit.  Had been apprehensive showing up to a drinking environment, but turned out not to be a grueling temptation.  Needed the evidence that she is better put together than that.  Learned things, as hoped, for her profession.  Realizes she needs more experience out in the world either doing activities (e.g., a kayaking day on the lake), or socializing (trying to do something 5/wk), or more physical (5 days/wk working back to running).  Has a work social coming up which will be that [employer].  Looking to be more ready to reveal of herself.  Discussed coming up with a few "fun facts" she can keep handy for when people ask about her.  Looking to go to [place] with B and SIL in 2.5 wks, figures she'll take opportunity to try to reveal a bit more of herself, including some of what from childhood drove her to drink, isolate, and be depressed.  Discussed options and needs, weighing out things like the pivotal magic marker story Company secretary and sibs, at M's direction, held her down and drew on her bare butt).  Challenged to consider and compare levels to go to with [facility], and to go ahead and try revealing to her sponsor the magic marker/"fanny face" story.    Repeated sightings now of [place] at the park, which can be sensitizing.  Tying to maintain control of wondering if he sighted  her and stay OK either way.  Clear she has spent all the energy on him ever needed, nothing to prove now, and she can just let him live his life without having to reckon with leading her on and recapitulating her shame a decade ago.    Therapeutic modalities: Cognitive Behavioral Therapy, Solution-Oriented/Positive Psychology, and Ego-Supportive  Mental Status/Observations:  Appearance:   Casual     Behavior:  Appropriate  Motor:  Normal  Speech/Language:   Clear and Coherent  Affect:  Appropriate  Mood:  normal, approp to issues  Thought process:  normal  Thought content:    WNL  Sensory/Perceptual disturbances:    WNL (hearing aids)  Orientation:  Fully oriented  Attention:  Good    Concentration:  Good  Memory:  WNL  Insight:    Good  Judgment:   Good  Impulse Control:  Good   Risk Assessment: Danger to Self: No Self-injurious Behavior: No Danger to Others: No Physical Aggression / Violence: No Duty to Warn: No Access to Firearms a concern: No  Assessment of progress:  progressing well  Diagnosis:   ICD-10-CM   1. Generalized anxiety disorder  F41.1     2. Social anxiety disorder  F40.10     3. Depression, major, recurrent, in partial remission ([place])  F33.41     4. Alcohol dependence in longterm remission ([place])  F10.21     5. History of posttraumatic stress disorder (PTSD)  X72.59     6. History of dysthymia  Z86.59      Plan:  Maintain renewed exercise program Maintain nutritional program Cont AA involvement, sponsorship as able Share "fanny face" story with sponsor, consider levels of revelation to brother Catalina [place] Extend social involvements beyond work to reacquaint further with social world and potential friends/relationship Come up with a few "fun facts" on herself as ice breakers in service of practice socializing and revealing of herself Practice seeing [name] as either free or small-minded and in the process of creating his own troubles enough that there is  no need to seek a reckoning with him Other recommendations/advice as may be noted above Continue to utilize previously learned skills ad lib Maintain medication as prescribed and work faithfully with relevant prescriber(s) if any changes are desired or seem indicated Call the clinic on-call service, 988/hotline, 911, or present to [facility] or ER if any life-threatening psychiatric crisis Return for session(s) already scheduled, avail earlier @ PT's need, may call for brief followup to challenge. Already scheduled visit in this office 12/28/2021.  [name], PhD Luan Moore, PhD LP Clinical Psychologist, [facility] [address], [place], [place] [address] [phone]

## 2021-12-26 ENCOUNTER — Other Ambulatory Visit: Payer: Self-pay | Admitting: Physician Assistant

## 2021-12-26 ENCOUNTER — Telehealth: Payer: Self-pay | Admitting: Physician Assistant

## 2021-12-26 MED ORDER — VIIBRYD 40 MG PO TABS: 40.0000 mg | ORAL_TABLET | Freq: Every day | ORAL | 1 refills | Status: DC

## 2021-12-26 MED ORDER — ARIPIPRAZOLE 2 MG PO TABS: 1.0000 mg | ORAL_TABLET | Freq: Every day | ORAL | 1 refills | Status: DC

## 2021-12-26 NOTE — Telephone Encounter (Signed)
Pt called asking to RTC. Wants to get off Abilify. Pt # [phone]  APT 8/25

## 2021-12-26 NOTE — Telephone Encounter (Signed)
Pt informed

## 2021-12-26 NOTE — Telephone Encounter (Signed)
Pt stated she gained a lot of weight on Abilify and it also seems to cause her to have no emotions.She wants to stop med.She stated due to insurance she switched from brand viibryd to generic and wants to know if we can try to do another PA and get the brand approved because there is a big difference.

## 2021-12-26 NOTE — Telephone Encounter (Signed)
I know the Abilify pills are small but have her take 1/2 every morning for 1 week and then stop.  If she is not able to split it exactly in half, do the best she can. I sent in the prescription for Viibryd brand name to [facility]. Traci, this is just an FYI for you.

## 2021-12-27 ENCOUNTER — Telehealth: Payer: Self-pay | Admitting: Physician Assistant

## 2021-12-27 NOTE — Telephone Encounter (Signed)
Austin at the pharmacy LVM @ 8/7 4:52p.  He said the script they received say Viibryd should be "name brand only".  But the pt has been getting generic and it has been getting paid for by the insurance company.  The brand name is not covered by the insurance company.  They need to know if the pt really must have brand name.  Next appt 8/25

## 2021-12-27 NOTE — Telephone Encounter (Signed)
I didn't see notation on Rx, but it does have Viibryd and not the generic form.  Ok to resend as generic?

## 2021-12-27 NOTE — Telephone Encounter (Signed)
Yes it needs to be name brand.  Patient called yesterday and stated it works better than the generic and asked for a new prescription. We will try to get it authorized, if not, then she will probably want to go back to generic due to expense.

## 2021-12-27 NOTE — Telephone Encounter (Signed)
Patient wants brand name Viibryd. Pharmacy said she needs PA. Patient aware of PA need.

## 2021-12-28 ENCOUNTER — Ambulatory Visit: Payer: No Typology Code available for payment source | Admitting: Psychiatry

## 2021-12-30 NOTE — Telephone Encounter (Signed)
Her PA is being reviewed waiting on response for Brand Viibryd

## 2022-01-09 NOTE — Telephone Encounter (Signed)
Pt may already know but her PA for Ethelene Browns is approved effective 12/30/2021-12/31/2022 with [employer]

## 2022-01-13 ENCOUNTER — Ambulatory Visit (INDEPENDENT_AMBULATORY_CARE_PROVIDER_SITE_OTHER): Payer: No Typology Code available for payment source | Admitting: Physician Assistant

## 2022-01-13 ENCOUNTER — Encounter: Payer: Self-pay | Admitting: Physician Assistant

## 2022-01-13 VITALS — BP 142/88 | HR 64

## 2022-01-13 DIAGNOSIS — F4010 Social phobia, unspecified: Secondary | ICD-10-CM | POA: Diagnosis not present

## 2022-01-13 DIAGNOSIS — F331 Major depressive disorder, recurrent, moderate: Secondary | ICD-10-CM

## 2022-01-13 DIAGNOSIS — F4310 Post-traumatic stress disorder, unspecified: Secondary | ICD-10-CM

## 2022-01-13 DIAGNOSIS — F518 Other sleep disorders not due to a substance or known physiological condition: Secondary | ICD-10-CM

## 2022-01-13 DIAGNOSIS — R030 Elevated blood-pressure reading, without diagnosis of hypertension: Secondary | ICD-10-CM

## 2022-01-13 DIAGNOSIS — F1011 Alcohol abuse, in remission: Secondary | ICD-10-CM

## 2022-01-13 NOTE — Progress Notes (Unsigned)
[facility]  Patient ID: Amanda Dillon,  MRN: [id number]  PCP: Donald Prose, MD  Date of Evaluation: 01/13/2022 Time spent:30 minutes  Chief Complaint:   HISTORY/CURRENT STATUS: HPI For routine med check.      Denies dizziness, syncope, seizures, numbness, tingling, tremor, tics, unsteady gait, slurred speech, confusion. Denies muscle or joint pain, stiffness, or dystonia. Denies unexplained weight loss, frequent infections, or sores that heal slowly.  No polyphagia, polydipsia, or polyuria. Denies visual changes or paresthesias.   Individual Medical History/ Review of Systems: Changes? :No   Past medications for mental health diagnoses include: Uncertain.  She has taken a higher dose of Wellbutrin XL which did help but she has not needed that strength for a while.   Allergies: Patient has no known allergies.  Current Medications:  Current Outpatient Medications:    buPROPion (WELLBUTRIN XL) 150 MG 24 hr tablet, Take 3 tablets (450 mg total) by mouth daily., Disp: 270 tablet, Rfl: 1   estradiol (ESTRACE) 2 MG tablet, Take 1 tablet by mouth daily., Disp: , Rfl:    prazosin (MINIPRESS) 5 MG capsule, Take 1 capsule (5 mg total) by mouth at bedtime. (Patient taking differently: Take 6 mg by mouth at bedtime.), Disp: 90 capsule, Rfl: 0   progesterone (PROMETRIUM) 100 MG capsule, Take 100 mg by mouth at bedtime., Disp: , Rfl:    VIIBRYD 40 MG TABS, Take 1 tablet (40 mg total) by mouth daily., Disp: 90 tablet, Rfl: 1   Vitamin D, Ergocalciferol, (DRISDOL) 1.25 MG (50000 UNIT) CAPS capsule, Take 50,000 Units by mouth every 7 (seven) days., Disp: , Rfl:  Medication Side Effects: none  Family Medical/ Social History: Changes? no  MENTAL HEALTH EXAM:  There were no vitals taken for this visit.There is no height or weight on file to calculate BMI.  General Appearance: Casual, Well Groomed and Obese  Eye Contact:  Good  Speech:  Clear and Coherent and Normal Rate  Volume:   Normal  Mood:  Euthymic  Affect:  Congruent  Thought Process:  Goal Directed and Descriptions of Associations: Circumstantial  Orientation:  Full (Time, Place, and Person)  Thought Content: Logical   Suicidal Thoughts:  No  Homicidal Thoughts:  No  Memory:  WNL  Judgement:  Good  Insight:  Good  Psychomotor Activity:  Normal  Concentration:  Concentration: Good  Recall:  Good  Fund of Knowledge: Good  Language: Good  Assets:  Desire for Improvement  ADL's:  Intact  Cognition: WNL  Prognosis:  Good   Labs through [facility] 05/06/2021, lipid panel normal except total cholesterol 232, LDL 147.  Glucose 74. see Care Everywhere. Discussed with the patient.    DIAGNOSES:  No diagnosis found.    Receiving Psychotherapy: Yes With Dr. Luan Moore   RECOMMENDATIONS:  PDMP was reviewed.  No controlled substances filled since 03/05/2020, small quantity of oxycodone.   Brand name Viibryd Consider [place], or Auvelity, remember history of alcoholism and whether dextromethorphan is safe or not  Continue Abilify 2 mg, 1 p.o. every morning. Continue Wellbutrin XL 450 mg daily. Continue prazosin  6 mg p.o. nightly. Continue Viibryd 40 mg, 1 p.o. daily. Continue vitamin D 50,000 IUs, 1 p.o. weekly. Continue therapy with Dr. Luan Moore. Return in 3 months.  Donnal Moat, PA-C

## 2022-02-22 ENCOUNTER — Ambulatory Visit (INDEPENDENT_AMBULATORY_CARE_PROVIDER_SITE_OTHER): Payer: No Typology Code available for payment source | Admitting: Psychiatry

## 2022-02-22 DIAGNOSIS — Z8659 Personal history of other mental and behavioral disorders: Secondary | ICD-10-CM | POA: Diagnosis not present

## 2022-02-22 DIAGNOSIS — F1011 Alcohol abuse, in remission: Secondary | ICD-10-CM | POA: Diagnosis not present

## 2022-02-22 DIAGNOSIS — Z636 Dependent relative needing care at home: Secondary | ICD-10-CM

## 2022-02-22 DIAGNOSIS — F4010 Social phobia, unspecified: Secondary | ICD-10-CM | POA: Diagnosis not present

## 2022-02-22 DIAGNOSIS — F4310 Post-traumatic stress disorder, unspecified: Secondary | ICD-10-CM
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
## 2022-04-10 ENCOUNTER — Telehealth: Payer: Self-pay | Admitting: Physician Assistant
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
## 2022-04-10 NOTE — Telephone Encounter (Signed)
Patient lvm at 10:41 stating that [facility] was to reach out to [facility] regarding Availty. She wanted to make sure that it was sent to them. Ph: [phone]

## 2022-04-10 NOTE — Telephone Encounter (Signed)
Please review

## 2022-04-11 ENCOUNTER — Encounter: Payer: Self-pay | Admitting: Physician Assistant

## 2022-04-11 ENCOUNTER — Ambulatory Visit (INDEPENDENT_AMBULATORY_CARE_PROVIDER_SITE_OTHER): Payer: No Typology Code available for payment source | Admitting: Physician Assistant

## 2022-04-11 DIAGNOSIS — F515 Nightmare disorder: Secondary | ICD-10-CM | POA: Diagnosis not present

## 2022-04-11 DIAGNOSIS — Z566 Other physical and mental strain related to work: Secondary | ICD-10-CM | POA: Diagnosis not present

## 2022-04-11 DIAGNOSIS — F329 Major depressive disorder, single episode, unspecified: Secondary | ICD-10-CM

## 2022-04-11 DIAGNOSIS — F4310 Post-traumatic stress disorder, unspecified: Secondary | ICD-10-CM | POA: Diagnosis not present

## 2022-04-11 DIAGNOSIS — F1011 Alcohol abuse, in remission: Secondary | ICD-10-CM

## 2022-04-11 MED ORDER — AUVELITY 45-105 MG PO TBCR: 45.0000 mg | EXTENDED_RELEASE_TABLET | Freq: Two times a day (BID) | ORAL | 1 refills | Status: DC

## 2022-04-11 MED ORDER — PRAZOSIN HCL 5 MG PO CAPS: 5.0000 mg | ORAL_CAPSULE | Freq: Every day | ORAL | 1 refills | Status: DC

## 2022-04-11 NOTE — Progress Notes (Unsigned)
[facility]  Patient ID: Amanda Dillon,  MRN: [id number]  PCP: Donald Prose, MD  Date of Evaluation: 01/13/2022 Time spent:20 minutes  Chief Complaint:  Chief Complaint   Anxiety; Depression; Follow-up    HISTORY/CURRENT STATUS: HPI For routine med check.  Still feels 'flat.' Anhedonia, decreased energy and motivation. Is able to work, not missing days but spends all her energy on that, has nothing left at the end of the day.   No extreme sadness, tearfulness, or feelings of hopelessness.  Sleeps well most of the time. Nightmares are controlled w/ Prazosin. ADLs and personal hygiene are normal.   Denies any changes in concentration, making decisions, or remembering things.  Appetite has not changed.  Weight is stable. Not drinking. Sober for around 13 years now.  Anxiety isn't a big issue. Sometimes will feel overwhelmed but not having PA.  Denies suicidal or homicidal thoughts.  Patient denies increased energy with decreased need for sleep, increased talkativeness, racing thoughts, impulsivity or risky behaviors, increased spending, increased libido, grandiosity, increased irritability or anger, paranoia, or hallucinations.  Denies dizziness, syncope, seizures, numbness, tingling, tremor, tics, unsteady gait, slurred speech, confusion. Denies muscle or joint pain, stiffness, or dystonia. Denies unexplained weight loss, frequent infections, or sores that heal slowly.  No polyphagia, polydipsia, or polyuria. Denies visual changes or paresthesias.   Individual Medical History/ Review of Systems: Changes? :No   Past medications for mental health diagnoses include: Uncertain.  She has taken a higher dose of Wellbutrin XL which did help but she has not needed that strength for a while. Abilify caused wt gain.  Allergies: Patient has no known allergies.  Current Medications:  Current Outpatient Medications:    buPROPion (WELLBUTRIN XL) 300 MG 24 hr tablet, Take 1 tablet (300 mg  total) by mouth daily. For 7 days then stop, Disp: 7 tablet, Rfl: 0   Dextromethorphan-buPROPion ER (AUVELITY) 45-105 MG TBCR, Take 45-105 mg by mouth 2 (two) times daily., Disp: 60 tablet, Rfl: 1   estradiol (ESTRACE) 2 MG tablet, Take 1 tablet by mouth daily., Disp: , Rfl:    progesterone (PROMETRIUM) 100 MG capsule, Take 100 mg by mouth at bedtime., Disp: , Rfl:    VIIBRYD 40 MG TABS, Take 1 tablet (40 mg total) by mouth daily., Disp: 90 tablet, Rfl: 1   Vitamin D, Ergocalciferol, (DRISDOL) 1.25 MG (50000 UNIT) CAPS capsule, Take 50,000 Units by mouth every 7 (seven) days., Disp: , Rfl:    prazosin (MINIPRESS) 5 MG capsule, Take 1 capsule (5 mg total) by mouth at bedtime. With 1 mg=6 mg, Disp: 90 capsule, Rfl: 1 Medication Side Effects: none  Family Medical/ Social History: Changes? no  MENTAL HEALTH EXAM:  There were no vitals taken for this visit.There is no height or weight on file to calculate BMI.  General Appearance: Casual, Well Groomed and Obese  Eye Contact:  Good  Speech:  Clear and Coherent and Normal Rate  Volume:  Normal  Mood:  Euthymic  Affect:  Congruent  Thought Process:  Goal Directed and Descriptions of Associations: Circumstantial  Orientation:  Full (Time, Place, and Person)  Thought Content: Logical   Suicidal Thoughts:  No  Homicidal Thoughts:  No  Memory:  WNL  Judgement:  Good  Insight:  Good  Psychomotor Activity:  Normal  Concentration:  Concentration: Good and Attention Span: Good  Recall:  Good  Fund of Knowledge: Good  Language: Good  Assets:  Communication Skills Desire for Improvement Financial Resources/Insurance Housing Intimacy  Transportation Vocational/Educational  ADL's:  Intact  Cognition: WNL  Prognosis:  Good   DIAGNOSES:    ICD-10-CM   1. Treatment-resistant depression  F32.9     2. PTSD (post-traumatic stress disorder)  F43.10     3. Nightmares  F51.5     4. Work stress  Z56.6     5. History of alcohol abuse  F10.11        Receiving Psychotherapy: Yes With Dr. Luan Moore  RECOMMENDATIONS:  PDMP was reviewed.  No results available.  I provided 20 minutes of face to face time during this encounter, including time spent before and after the visit in records review, medical decision making, counseling pertinent to today's visit, and charting.   Discussed auvelity. Benefits, risks, SE discussed. I talked with Dr. Clovis Pu about this in relation to h/o alcohol abuse, whether dextromethorphan may cause cravings or a 'high' or not. He stated it would take a much higher dose of it to cause the high and should not cause cravings. She understands and would like to try it.  Wean off Wellbutrin XL by decreasing to 300 mg daily for 1 week, then start Auvelity.  Start Auvelity 45-105 mg, 1 p.o. twice daily.  Samples and co-pay card not available but she will check on line for a coupon.  Prescription sent to my scripts. Continue prazosin  6 mg p.o. nightly. Continue Viibryd 40 mg, 1 p.o. daily. Continue vitamin D 50,000 IUs, 1 p.o. weekly. Continue therapy with Dr. Luan Moore. Return in 4-6 wks.  Donnal Moat, PA-C

## 2022-04-11 NOTE — Telephone Encounter (Signed)
She has an appt w/ me this afternoon so will discuss then.

## 2022-04-12 MED ORDER — BUPROPION HCL ER (XL) 300 MG PO TB24: 300.0000 mg | ORAL_TABLET | Freq: Every day | ORAL | 0 refills | Status: DC

## 2022-04-26 ENCOUNTER — Ambulatory Visit (INDEPENDENT_AMBULATORY_CARE_PROVIDER_SITE_OTHER): Payer: No Typology Code available for payment source | Admitting: Psychiatry

## 2022-04-26 DIAGNOSIS — F331 Major depressive disorder, recurrent, moderate: Secondary | ICD-10-CM | POA: Diagnosis not present

## 2022-04-26 DIAGNOSIS — F4010 Social phobia, unspecified: Secondary | ICD-10-CM | POA: Diagnosis not present

## 2022-04-26 DIAGNOSIS — Z8659 Personal history of other mental and behavioral disorders: Secondary | ICD-10-CM

## 2022-04-26 DIAGNOSIS — F4310 Post-traumatic stress disorder, unspecified: Secondary | ICD-10-CM

## 2022-04-26 DIAGNOSIS — Z724 Inappropriate diet and eating habits: Secondary | ICD-10-CM

## 2022-04-26 DIAGNOSIS — F1011 Alcohol abuse, in remission: Secondary | ICD-10-CM
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
## 2022-05-11 ENCOUNTER — Other Ambulatory Visit: Payer: Self-pay | Admitting: Physician Assistant

## 2022-05-25 ENCOUNTER — Ambulatory Visit: Payer: No Typology Code available for payment source | Admitting: Physician Assistant

## 2022-06-05 ENCOUNTER — Ambulatory Visit (INDEPENDENT_AMBULATORY_CARE_PROVIDER_SITE_OTHER): Payer: No Typology Code available for payment source | Admitting: Physician Assistant

## 2022-06-05 ENCOUNTER — Encounter: Payer: Self-pay | Admitting: Physician Assistant

## 2022-06-05 DIAGNOSIS — F3341 Major depressive disorder, recurrent, in partial remission: Secondary | ICD-10-CM

## 2022-06-05 DIAGNOSIS — F515 Nightmare disorder: Secondary | ICD-10-CM | POA: Diagnosis not present

## 2022-06-05 DIAGNOSIS — F4010 Social phobia, unspecified: Secondary | ICD-10-CM

## 2022-06-05 MED ORDER — AUVELITY 45-105 MG PO TBCR: EXTENDED_RELEASE_TABLET | ORAL | 1 refills | Status: DC

## 2022-06-05 MED ORDER — VIIBRYD 40 MG PO TABS: 40.0000 mg | ORAL_TABLET | Freq: Every day | ORAL | 1 refills | Status: DC

## 2022-06-05 NOTE — Progress Notes (Signed)
[facility]  Patient ID: Amanda Dillon,  MRN: [id number]  PCP: Donald Prose, MD  Date of Evaluation: 06/05/2022  time spent:20 minutes  Chief Complaint:  Chief Complaint   Depression; Follow-up    HISTORY/CURRENT STATUS: HPI For routine med check.  Auvelity was started 6 weeks ago, changed from Wellbutrin. Feels better. "I think I'll be even better once it's daylight longer."  Patient is more able to enjoy things.  Energy and motivation are better.  Work is going well.   No extreme sadness, tearfulness, or feelings of hopelessness.  Sleeps well most of the time. No nightmares. Prazosin is working well.  ADLs and personal hygiene are normal.   Denies any changes in concentration, making decisions, or remembering things.  Appetite has not changed.  Weight is stable. Occasionally anxious but not severe.  Denies suicidal or homicidal thoughts.  Patient denies increased energy with decreased need for sleep, increased talkativeness, racing thoughts, impulsivity or risky behaviors, increased spending, increased libido, grandiosity, increased irritability or anger, paranoia, or hallucinations.  Denies dizziness, syncope, seizures, numbness, tingling, tremor, tics, unsteady gait, slurred speech, confusion. Denies muscle or joint pain, stiffness, or dystonia. Denies unexplained weight loss, frequent infections, or sores that heal slowly.  No polyphagia, polydipsia, or polyuria. Denies visual changes or paresthesias.   Individual Medical History/ Review of Systems: Changes? :No   Past medications for mental health diagnoses include: Uncertain.  She has taken a higher dose of Wellbutrin XL which did help but she has not needed that strength for a while. Abilify caused wt gain.  Allergies: Patient has no known allergies.  Current Medications:  Current Outpatient Medications:    estradiol (ESTRACE) 2 MG tablet, Take 1 tablet by mouth daily., Disp: , Rfl:    prazosin (MINIPRESS) 5 MG  capsule, Take 1 capsule (5 mg total) by mouth at bedtime. With 1 mg=6 mg, Disp: 90 capsule, Rfl: 1   progesterone (PROMETRIUM) 100 MG capsule, Take 100 mg by mouth at bedtime., Disp: , Rfl:    Vitamin D, Ergocalciferol, (DRISDOL) 1.25 MG (50000 UNIT) CAPS capsule, Take 50,000 Units by mouth every 7 (seven) days., Disp: , Rfl:    Dextromethorphan-buPROPion ER (AUVELITY) 45-105 MG TBCR, TAKE ONE (1) TABLET BY MOUTH TWICE DAILY, Disp: 180 tablet, Rfl: 1   VIIBRYD 40 MG TABS, Take 1 tablet (40 mg total) by mouth daily., Disp: 90 tablet, Rfl: 1 Medication Side Effects: none  Family Medical/ Social History: Changes? no  MENTAL HEALTH EXAM:  There were no vitals taken for this visit.There is no height or weight on file to calculate BMI.  General Appearance: Casual and Well Groomed  Eye Contact:  Good  Speech:  Clear and Coherent and Normal Rate  Volume:  Normal  Mood:  Euthymic  Affect:  Congruent  Thought Process:  Goal Directed and Descriptions of Associations: Circumstantial  Orientation:  Full (Time, Place, and Person)  Thought Content: Logical   Suicidal Thoughts:  No  Homicidal Thoughts:  No  Memory:  WNL  Judgement:  Good  Insight:  Good  Psychomotor Activity:  Normal  Concentration:  Concentration: Good and Attention Span: Good  Recall:  Good  Fund of Knowledge: Good  Language: Good  Assets:  Desire for Improvement Financial Resources/Insurance Housing Transportation Vocational/Educational  ADL's:  Intact  Cognition: WNL  Prognosis:  Good   DIAGNOSES:    ICD-10-CM   1. Recurrent major depressive disorder, in partial remission ([place])  F33.41     2. Social anxiety  disorder  F40.10     3. Nightmares  F51.5       Receiving Psychotherapy: Yes With Dr. Luan Moore  RECOMMENDATIONS:  PDMP was reviewed.  No controlled substances. I provided 20 minutes of face to face time during this encounter, including time spent before and after the visit in records review, medical  decision making, counseling pertinent to today's visit, and charting.   I am glad to see her better on the Auvelity.  No change needed.  Continue Auvelity 45-105 mg, 1 p.o. twice daily.  (Prescription to my scripts.) Continue prazosin  6 mg p.o. nightly. Continue Viibryd 40 mg, 1 p.o. daily. Continue vitamin D 50,000 IUs, 1 p.o. weekly. Continue therapy with Dr. Luan Moore. Return in 4 months.  Donnal Moat, PA-C

## 2022-06-21 ENCOUNTER — Ambulatory Visit: Payer: No Typology Code available for payment source | Admitting: Psychiatry

## 2022-07-06 ENCOUNTER — Telehealth: Payer: Self-pay

## 2022-07-06 NOTE — Telephone Encounter (Signed)
Prior Approval received for Auvelity 45-105 mg effective through 07/07/2023, PA# [id number] with [facility]

## 2022-07-06 NOTE — Telephone Encounter (Signed)
Prior Authorization submitted for Auvelity 45-105 mg with [facility], pending response

## 2022-07-06 NOTE — Telephone Encounter (Signed)
Patient notified

## 2022-08-16 ENCOUNTER — Ambulatory Visit: Payer: No Typology Code available for payment source | Admitting: Psychiatry

## 2022-10-04 ENCOUNTER — Ambulatory Visit (INDEPENDENT_AMBULATORY_CARE_PROVIDER_SITE_OTHER): Payer: No Typology Code available for payment source | Admitting: Physician Assistant

## 2022-10-04 ENCOUNTER — Encounter: Payer: Self-pay | Admitting: Physician Assistant

## 2022-10-04 DIAGNOSIS — F4310 Post-traumatic stress disorder, unspecified: Secondary | ICD-10-CM | POA: Diagnosis not present

## 2022-10-04 DIAGNOSIS — F4010 Social phobia, unspecified: Secondary | ICD-10-CM | POA: Diagnosis not present

## 2022-10-04 DIAGNOSIS — F515 Nightmare disorder: Secondary | ICD-10-CM

## 2022-10-04 DIAGNOSIS — F3341 Major depressive disorder, recurrent, in partial remission: Secondary | ICD-10-CM

## 2022-10-04 MED ORDER — PRAZOSIN HCL 5 MG PO CAPS: 5.0000 mg | ORAL_CAPSULE | Freq: Every day | ORAL | 1 refills | Status: DC

## 2022-10-04 MED ORDER — AUVELITY 45-105 MG PO TBCR: EXTENDED_RELEASE_TABLET | ORAL | 1 refills | Status: DC

## 2022-10-04 MED ORDER — PRAZOSIN HCL 1 MG PO CAPS: 1.0000 mg | ORAL_CAPSULE | Freq: Every day | ORAL | 1 refills | Status: DC

## 2022-10-04 NOTE — Progress Notes (Signed)
[facility]  Patient ID: Amanda Dillon,  MRN: [id number]  PCP: Deatra James, MD  Date of Evaluation: 10/04/2022  time spent:20 minutes  Chief Complaint:  Chief Complaint   Depression; Follow-up    HISTORY/CURRENT STATUS: HPI For routine med check.  Has been on Auvelity for about 6 months now.  Feels better.  Thinks the combo she's on is working well. Brand name Viibryd works well. She's been required by her insurance to take generic in the past and it wasn't effective.  Patient is more able to enjoy things.  Energy and motivation are better.  Work is going well, busy but fine.   No extreme sadness, tearfulness, or feelings of hopelessness.  Sleeps fairly well most of the time.  No nightmares.  Prazosin is working well.  ADLs and personal hygiene are normal.   Denies any changes in concentration, making decisions, or remembering things.  Appetite has not changed.  Weight is stable. Occasionally anxious but not severe.  Not ETOH in years now. Denies suicidal or homicidal thoughts.  Patient denies increased energy with decreased need for sleep, increased talkativeness, racing thoughts, impulsivity or risky behaviors, increased spending, increased libido, grandiosity, increased irritability or anger, paranoia, or hallucinations.  Denies dizziness, syncope, seizures, numbness, tingling, tremor, tics, unsteady gait, slurred speech, confusion. Denies muscle or joint pain, stiffness, or dystonia. Denies unexplained weight loss, frequent infections, or sores that heal slowly.  No polyphagia, polydipsia, or polyuria. Denies visual changes or paresthesias.   Individual Medical History/ Review of Systems: Changes? :No   Past medications for mental health diagnoses include: Uncertain.  She has taken a higher dose of Wellbutrin XL which did help but she has not needed that strength for a while. Abilify caused wt gain.  Allergies: Patient has no known allergies.  Current Medications:   Current Outpatient Medications:    estradiol (ESTRACE) 2 MG tablet, Take 1 tablet by mouth daily., Disp: , Rfl:    progesterone (PROMETRIUM) 100 MG capsule, Take 100 mg by mouth at bedtime., Disp: , Rfl:    VIIBRYD 40 MG TABS, Take 1 tablet (40 mg total) by mouth daily., Disp: 90 tablet, Rfl: 1   Vitamin D, Ergocalciferol, (DRISDOL) 1.25 MG (50000 UNIT) CAPS capsule, Take 50,000 Units by mouth every 7 (seven) days., Disp: , Rfl:    Dextromethorphan-buPROPion ER (AUVELITY) 45-105 MG TBCR, TAKE ONE (1) TABLET BY MOUTH TWICE DAILY, Disp: 180 tablet, Rfl: 1   prazosin (MINIPRESS) 1 MG capsule, Take 1 capsule (1 mg total) by mouth at bedtime. Take with the 2 mg dose=3 mg qhs., Disp: 90 capsule, Rfl: 1   prazosin (MINIPRESS) 5 MG capsule, Take 1 capsule (5 mg total) by mouth at bedtime. With 1 mg=6 mg, Disp: 90 capsule, Rfl: 1 Medication Side Effects: none  Family Medical/ Social History: Changes? no  MENTAL HEALTH EXAM:  There were no vitals taken for this visit.There is no height or weight on file to calculate BMI.  General Appearance: Casual and Well Groomed  Eye Contact:  Good  Speech:  Clear and Coherent and Normal Rate  Volume:  Normal  Mood:  Euthymic  Affect:  Congruent  Thought Process:  Goal Directed and Descriptions of Associations: Circumstantial  Orientation:  Full (Time, Place, and Person)  Thought Content: Logical   Suicidal Thoughts:  No  Homicidal Thoughts:  No  Memory:  WNL  Judgement:  Good  Insight:  Good  Psychomotor Activity:  Normal  Concentration:  Concentration: Good and Attention Span:  Good  Recall:  Good  Fund of Knowledge: Good  Language: Good  Assets:  Communication Skills Desire for Improvement Financial Resources/Insurance Housing Transportation Vocational/Educational  ADL's:  Intact  Cognition: WNL  Prognosis:  Good   DIAGNOSES:    ICD-10-CM   1. Recurrent major depressive disorder, in partial remission (HCC)  F33.41     2. Social anxiety  disorder  F40.10     3. PTSD (post-traumatic stress disorder)  F43.10     4. Nightmares  F51.5       Receiving Psychotherapy: No With Dr. [name] in the past  RECOMMENDATIONS:  PDMP was reviewed.  No controlled substances. I provided 20 minutes of face to face time during this encounter, including time spent before and after the visit in records review, medical decision making, counseling pertinent to today's visit, and charting.   She's doing great so no changes needed.   Continue Auvelity 45-105 mg, 1 p.o. twice daily. Continue prazosin  6 mg p.o. nightly. Continue Viibryd 40 mg, 1 p.o. daily. (Must be brand. Generic is NOT effective.) Continue vitamin D 50,000 IUs, 1 p.o. weekly. Therapy with Dr. Mardelle Matte Mitchum prn.  Return in 6 months.  Melony Overly, PA-C

## 2022-10-17 ENCOUNTER — Ambulatory Visit (INDEPENDENT_AMBULATORY_CARE_PROVIDER_SITE_OTHER): Payer: No Typology Code available for payment source | Admitting: Psychiatry

## 2022-10-17 DIAGNOSIS — F4010 Social phobia, unspecified: Secondary | ICD-10-CM

## 2022-10-17 DIAGNOSIS — Z566 Other physical and mental strain related to work: Secondary | ICD-10-CM | POA: Diagnosis not present

## 2022-10-17 DIAGNOSIS — Z636 Dependent relative needing care at home: Secondary | ICD-10-CM

## 2022-10-17 DIAGNOSIS — F1011 Alcohol abuse, in remission: Secondary | ICD-10-CM

## 2022-10-17 DIAGNOSIS — Z724 Inappropriate diet and eating habits: Secondary | ICD-10-CM

## 2022-10-17 DIAGNOSIS — F3341 Major depressive disorder, recurrent, in partial remission: Secondary | ICD-10-CM | POA: Diagnosis not present

## 2022-10-17 DIAGNOSIS — F4310 Post-traumatic stress disorder, unspecified: Secondary | ICD-10-CM

## 2022-10-17 DIAGNOSIS — Z8659 Personal history of other mental and behavioral disorders: Secondary | ICD-10-CM

## 2022-10-17 NOTE — Progress Notes (Signed)
Psychotherapy Progress Note [facility] [name], PhD LP  Patient ID: Amanda Dillon [facility])    MRN: [id number] Therapy format: Individual psychotherapy Date: 10/17/2022      Start: 5:10p     Stop: 6:00p     Time Spent: 50 min Location: In-person   Session narrative (presenting needs, interim history, self-report of stressors and symptoms, applications of prior therapy, status changes, and interventions made in session) 6 months since last seen.  Reports work has been very stressful, for lack of leadership and much falling to herself.  Stress eating a lot, getting exhausted and irritable.  Supervisor works remotely, from [place], only on-site a few weeks in 5 months' time.  Can see various things as problematic in the organization but has not yet brought it up with her supervisor.  Brainstormed messages -- some requirements are burning her out (e.g., when working 14 hrs covering case managers being out, plus her own supervisor role, for 3 weeks), and largely having to keep up with 64 direct reports (vs 26 and 12 for the other two managers).  The latter came up last week, but no indication whether something is being considered to be done to even the load.  Reinforced being willing to bring it up explicitly how the systemic imbalance is unsustainable for anyone, including being willing to admit to enabling it to some extent through her own tendencies to avoid conflict and to automatically accept or volunteer so she can't be mistaken for being otherwise.  Personally, admits that her ice cream habit is back on to the point where she eats continuously through evening and passes out.  Tactical advice to restrain ice cream purchases, and pick up the practice of acknowledging how she feels -- preferably in writing -- before making any decisions to self-medicate with food.  Hearing has been tested.  Definitely needs aids.  Has priced at $6K, but [employer] can do $1.5K.  Affirmed and  encouraged.  Stefano Gaul has added some prominent stress, as well, chronically working with depression and intermittent suicidal feelings.  Discussed what she can legitimately offer, including taking a problematic dog off her hands and potentially putting her up for a respite stay if she feels overwhelmed and/or in danger of harming herself an is unwilling and noncommittable to hospitalize.  Likely case that [name] will need to relocate to get out from under a too-expensive, too-large house, and possibly some tactful mediation with her daughter, who stayed with [name] several weeks a few months last fall while on a traveling nurse job.  Can still be haunted by the possibility of [name] becoming another friend to suicide.  Reviewed reasons to worry and to trust, and strategies to communicate with her frankly and constructively.  Therapeutic modalities: Cognitive Behavioral Therapy, Solution-Oriented/Positive Psychology, Ego-Supportive, and Assertiveness/Communication  Mental Status/Observations:  Appearance:   Casual     Behavior:  Appropriate  Motor:  Normal  Speech/Language:   Clear and Coherent  Affect:  Appropriate  Mood:  anxious  Thought process:  normal  Thought content:    WNL and worry  Sensory/Perceptual disturbances:    Hearing compromised, needs aids  Orientation:  Fully oriented  Attention:  Good    Concentration:  Good  Memory:  WNL  Insight:    Good  Judgment:   Good  Impulse Control:  Good   Risk Assessment: Danger to Self: No Self-injurious Behavior: No Danger to Others: No Physical Aggression / Violence: No Duty to Warn: No Access to  Firearms a concern: No  Assessment of progress:  stabilized  Diagnosis:   ICD-10-CM   1. Recurrent major depressive disorder, in partial remission (HCC)  F33.41     2. Social anxiety disorder  F40.10     3. Eating problem  Z72.4     4. Work stress  Z56.6     5. Caregiver stress  Z63.6     6. PTSD (post-traumatic stress  disorder) - stable  F43.10     7. History of dysthymia  Z86.59     8. History of alcohol abuse  F10.11      Plan:  Work stress -- prepare to address burnout factors with supervisor Caregiving stress  OK to continue working with [name] and liaison with her kids at discretion Tactics for frank and constructive dealings around chronic SI, lingering threat of suicide, and making more constructive life choices to address multiple stressful/depressogenic conditions Advise Al-Anon to [employer] Continuing bilateral consent to coordinate with [name] as a fellow patient and designated emergency call for [employer] Binge eating Option OA Response cost -- only buy ice cream in single servings, do not store Emotional awareness before deciding to binge -- identify and write feelings first Self-care Maintain exercise and nutritional programs Continue AA involvement and sponsorship as able Trauma and shame exposure Consider sharing "fanny face" story or other revelations to select siblings or friends As interested, practice thinking of [name] but seeing him as either free of his old ways or small-minded and creating his own troubles enough not to seek him out Re father's overwrought approaches, use assertive inquiry or changing the subject, at discretion Social integration -- extend social involvements beyond work, come up with a few "fun facts" on herself as ice breakers  Other recommendations/advice as may be noted above Continue to utilize previously learned skills ad lib Maintain medication as prescribed and work faithfully with relevant prescriber(s) if any changes are desired or seem indicated Call the clinic on-call service, 988/hotline, 911, or present to [facility] or ER if any life-threatening psychiatric crisis Return for time as available. Already scheduled visit in this office 01/10/2023.  [name], PhD [name], PhD LP Clinical Psychologist, [facility] [address], [address] [place], [place] [address] [phone]

## 2022-10-18 ENCOUNTER — Ambulatory Visit: Payer: No Typology Code available for payment source | Admitting: Psychiatry

## 2022-12-05 ENCOUNTER — Ambulatory Visit (INDEPENDENT_AMBULATORY_CARE_PROVIDER_SITE_OTHER): Payer: No Typology Code available for payment source | Admitting: Psychiatry

## 2022-12-05 DIAGNOSIS — Z566 Other physical and mental strain related to work: Secondary | ICD-10-CM

## 2022-12-05 DIAGNOSIS — F411 Generalized anxiety disorder: Secondary | ICD-10-CM | POA: Diagnosis not present

## 2022-12-05 DIAGNOSIS — Z636 Dependent relative needing care at home: Secondary | ICD-10-CM | POA: Diagnosis not present

## 2022-12-05 DIAGNOSIS — F3341 Major depressive disorder, recurrent, in partial remission: Secondary | ICD-10-CM | POA: Diagnosis not present

## 2022-12-05 NOTE — Progress Notes (Signed)
Psychotherapy Progress Note [facility] [name], PhD LP  Patient ID: Knyla Dwan [facility])    MRN: [id number] Therapy format: Individual psychotherapy Date: 12/05/2022      Start: 1:08p     Stop: 1:56p     Time Spent: 48 min Location: In-person   Session narrative (presenting needs, interim history, self-report of stressors and symptoms, applications of prior therapy, status changes, and interventions made in session) Since last met, took friend [name] in to her home on an urgent basis 2 weeks ago, in consultation with Tx for help staying safe through suicidal crisis.  Elected not to sequester her meds, initially, then found her very hard to wake the next day, spiking fear she had OD'd.  She had not, but it was all enough of a scare and a convincer of her need to get her to go willingly to [facility].  Unfortunately, it was complicated, sometimes abysmal experience, as they were redirected to [facility], which they both found remarkably insensitive, then referred back to [facility] once they worked through professional cynicism to make [name] needs clear, and she was admitted 7/5.  She remains there, with hope of discharge maybe next week.  For her part, Evagene has been stretched thin emotionally, and remains quite concerned [name] will despair even after discharge and suicide if she goes home to a very lonely, morose situation.  Did assist her further by rehoming her unruly dog for her, but very limited support system, continuing chronic pain, and prolonged despair over fractured relationship with her daughter.  Also wearing down her welcome for habit of texting in the night, sending screenshots of things she finds significant, and repeatedly coming around to apparent but poorly expressed need.  Looking ahead, Kea is on vacation July 27.  [name] remains inpatient at [facility], has been going to group, seems to be keeping normal hours.  Keah has visited 3x.  Knows she cannot agree to let her live at  her home unsupervised, or these fears will consume her.  Discussed and advised be in touch with [name] and her treatment team while she has the reinforcement of the hospital to work with.    Meanwhile, drawing on our consented contract to confer re [name] care -- as she is a flow patient, originally referred by [place] -- she checks an impression about Konrad Felix, [name] daughter, who has confided to her that she thinks Tx is sided against her and has apparently quoted from notes on [name] EHR.  Addressed concerning content, which seems to have been taken widely out of context, and the overriding concern that [name] daughter has been snooping in her medical record without authorization, which she knows as a nurse is a serious offense.  Stated relevant facts in case of need in helping work out [name] discharge planning and recommended Lionela pass along concerns to inpatient treatment team on her behalf.  Otherwise, has partly tamed compulsive overeating.  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  Therapeutic modalities: Cognitive Behavioral Therapy, Solution-Oriented/Positive Psychology, and Ego-Supportive  Mental Status/Observations:  Appearance:   Casual     Behavior:  Appropriate  Motor:  Normal  Speech/Language:   Clear and Coherent  Affect:  Appropriate  Mood:  anxious and wearied  Thought process:  normal  Thought content:    WNL  Sensory/Perceptual disturbances:    WNL  Orientation:  Fully oriented  Attention:  Good  Concentration:  Good  Memory:  WNL  Insight:    Good  Judgment:   Good  Impulse Control:  Good   Risk Assessment: Danger to Self: No Self-injurious Behavior: No Danger to Others: No Physical Aggression / Violence: No Duty to Warn: No Access to Firearms a concern: No  Assessment of progress:  progressing  Diagnosis:   ICD-10-CM   1. Recurrent major depressive  disorder, in partial remission (HCC)  F33.41     2. Generalized anxiety disorder  F41.1     3. Caregiver stress  Z63.6     4. Work stress  Z56.6      Plan:  Self-affirm good work helping her friend to safety and working constructively with her.  Actions as noted above re [name] acute care and discharge planning. Work stress -- prepare to address burnout factors with supervisor Caregiving stress  OK to continue working with [name] and liaison with her kids at discretion Tactics for frank and constructive dealings around chronic SI, lingering threat of suicide, and making more constructive life choices to address multiple stressful/depressogenic conditions Advise Al-Anon to [employer] Continuing bilateral consent to coordinate with [name] as a fellow patient and designated emergency call for [employer] Binge eating Option OA Response cost -- only buy ice cream in single servings, do not store Emotional awareness before deciding to binge -- identify and write feelings first Self-care Maintain exercise and nutritional programs Continue AA involvement and sponsorship as able Trauma and shame exposure Consider sharing "fanny face" story or other revelations to select siblings or friends As interested, practice thinking of [name] but seeing him as either free of his old ways or small-minded and creating his own troubles enough not to seek him out Re father's overwrought approaches, use assertive inquiry or changing the subject, at discretion Social integration -- extend social involvements beyond work, come up with a few "fun facts" on herself as ice breakers  Other recommendations/advice -- As may be noted above.  Continue to utilize previously learned skills ad lib. Medication compliance -- Maintain medication as prescribed and work faithfully with relevant prescriber(s) if any changes are desired or seem indicated. Crisis service -- Aware of call list and work-in appts.  Call the clinic on-call  service, 988/hotline, 911, or present to [facility] or ER if any life-threatening psychiatric crisis. Followup -- Return for time as available.  Next scheduled visit with me 01/10/2023.  Next scheduled in this office 01/10/2023.  [name], PhD [name], PhD LP Clinical Psychologist, [facility] [address] [place], [place] [address] [phone]

## 2022-12-20 ENCOUNTER — Telehealth: Payer: Self-pay | Admitting: Physician Assistant

## 2022-12-20 NOTE — Telephone Encounter (Signed)
I didn't see a note in Epic or in CMM that she had a previous PA for Viibryd. Called pharmacy and she filled a 90-day supply in May. Needs a RF, which I will take care of, but no mention of needing a PA.

## 2022-12-20 NOTE — Telephone Encounter (Signed)
Patient needs PA for Viibryd 40mg . States that last OA was good through around middle of August. Ph: [phone] Appt 11/18

## 2022-12-26 NOTE — Telephone Encounter (Signed)
Yes we discussed this a few months ago due to her getting BRAND. I will check her date of approval

## 2022-12-28 ENCOUNTER — Other Ambulatory Visit: Payer: Self-pay

## 2022-12-28 MED ORDER — VIIBRYD 40 MG PO TABS: 40.0000 mg | ORAL_TABLET | Freq: Every day | ORAL | 1 refills | Status: DC

## 2023-01-02 NOTE — Telephone Encounter (Signed)
Pending with [facility] for a renewal of BRAND Viibryd

## 2023-01-02 NOTE — Telephone Encounter (Signed)
Prior Approval received effective through 01/02/2024 PA# [id number] with [facility] BRAND Viibryd 40 mg

## 2023-01-10 ENCOUNTER — Ambulatory Visit: Payer: No Typology Code available for payment source | Admitting: Psychiatry

## 2023-01-10 DIAGNOSIS — F3341 Major depressive disorder, recurrent, in partial remission: Secondary | ICD-10-CM

## 2023-01-10 DIAGNOSIS — F411 Generalized anxiety disorder: Secondary | ICD-10-CM

## 2023-01-10 DIAGNOSIS — Z566 Other physical and mental strain related to work: Secondary | ICD-10-CM | POA: Diagnosis not present

## 2023-01-10 DIAGNOSIS — Z636 Dependent relative needing care at home: Secondary | ICD-10-CM | POA: Diagnosis not present

## 2023-01-10 DIAGNOSIS — Z8659 Personal history of other mental and behavioral disorders: Secondary | ICD-10-CM

## 2023-01-10 NOTE — Progress Notes (Signed)
Psychotherapy Progress Note [facility] [name], PhD LP  Patient ID: Amanda Dillon [facility])    MRN: [id number] Therapy format: Individual psychotherapy Date: 01/10/2023      Start: 6:17p     Stop: 7:03p     Time Spent: 46 min Location: In-person   Session narrative (presenting needs, interim history, self-report of stressors and symptoms, applications of prior therapy, status changes, and interventions made in session) Had a much-needed vacation and decompressed well.  Her problematic boss (all-remote from [place], didn't seem to get conditions) resigned, and the team is now led by a peer in an interim role, who may want to go permanent.  As workload goes, they have been gradually hiring resident social workers instead of temps, which should help stabilize work force and correct the imbalance in supervisory loads.  Affirmed and encouraged.  Re [name], need to discuss safety policy now that she has relocated to [place].  Acting on prior agreement to treat [name] as [name] virtual family and next of kin, reviewed indications of susceptibility to depression, anxiety, and intolerable feelings of rejection by her daughter, lethal means at her disposal (limited supply of medication), living situation (moved into apartment but much to settle yet, and house not yet sold), supports, and engagement in progress back in this community.  Agreed 1 week supply of meds can be extended, Nesreen's choice if she holds them herself or insists on family (son Congo), and framed pertinent questions and points to make that emphasize [name] commitment to safety and willingness to think beyond temporary pain or distress.  Therapeutic modalities: Cognitive Behavioral Therapy, Solution-Oriented/Positive Psychology, and Ego-Supportive  Mental Status/Observations:  Appearance:   Casual     Behavior:  Appropriate  Motor:  Normal  Speech/Language:   Clear and Coherent  Affect:  Appropriate  Mood:   anxious  Thought process:  normal  Thought content:    WNL  Sensory/Perceptual disturbances:    WNL  Orientation:  Fully oriented  Attention:  Good    Concentration:  Good  Memory:  WNL  Insight:    Good  Judgment:   Good  Impulse Control:  Good   Risk Assessment: Danger to Self: No Self-injurious Behavior: No Danger to Others: No Physical Aggression / Violence: No Duty to Warn: No Access to Firearms a concern: No  Assessment of progress:  progressing  Diagnosis:   ICD-10-CM   1. Generalized anxiety disorder  F41.1     2. Caregiver stress  Z63.6     3. Work stress  Z56.6     4. Recurrent major depressive disorder, in partial remission (HCC)  F33.41     5. History of posttraumatic stress disorder (PTSD)  Z86.59      Plan:  Work stress -- Continue to work to more reasonable workload and confer as actively as needed with new direct report about organizational structure and function Caregiving stress  Continuing bilateral consent to coordinate with [name] as a fellow patient and designated emergency call for [name] working with [name] and liaison with her kids at [employer] with [name] about Chiropodist as noted earlier for frank and constructive dealings around chronic SI, lingering threat of suicide, and making more constructive life choices to address multiple stressful/depressogenic conditions Advise Al-Anon to [name] eating Option OA Response cost -- only buy ice cream in single servings, do not store Emotional awareness before deciding to binge -- identify and write feelings first Self-care Maintain exercise and nutritional  programs Continue AA involvement and sponsorship as able Trauma and shame exposure Consider sharing "fanny face" story or other revelations to select siblings or friends As interested, practice thinking of [name] but seeing him as either free of his old ways or small-minded and creating his own troubles enough not  to seek him out Re father's overwrought approaches, use assertive inquiry or changing the subject, at discretion Social integration -- extend social involvements beyond work, come up with a few "fun facts" on herself as ice breakers  Other recommendations/advice -- As may be noted above.  Continue to utilize previously learned skills ad lib. Medication compliance -- Maintain medication as prescribed and work faithfully with relevant prescriber(s) if any changes are desired or seem indicated. Crisis service -- Aware of call list and work-in appts.  Call the clinic on-call service, 988/hotline, 911, or present to [facility] or ER if any life-threatening psychiatric crisis. Followup -- Return for time as already scheduled.  Next scheduled visit with me 02/28/2023.  Next scheduled in this office 02/28/2023.  [name], PhD [name], PhD LP Clinical Psychologist, [facility] [address] [place], [place] [address] [phone]

## 2023-02-05 ENCOUNTER — Encounter: Payer: PRIVATE HEALTH INSURANCE | Admitting: Family Medicine

## 2023-02-12 ENCOUNTER — Encounter: Payer: Self-pay | Admitting: Nurse Practitioner

## 2023-02-12 ENCOUNTER — Ambulatory Visit: Payer: PRIVATE HEALTH INSURANCE | Admitting: Nurse Practitioner

## 2023-02-12 VITALS — BP 159/100 | HR 68 | Temp 98.4°F | Ht 66.0 in | Wt 224.0 lb

## 2023-02-12 DIAGNOSIS — R030 Elevated blood-pressure reading, without diagnosis of hypertension: Secondary | ICD-10-CM

## 2023-02-12 DIAGNOSIS — Z6836 Body mass index (BMI) 36.0-36.9, adult: Secondary | ICD-10-CM | POA: Diagnosis not present

## 2023-02-12 DIAGNOSIS — E669 Obesity, unspecified: Secondary | ICD-10-CM | POA: Diagnosis not present

## 2023-02-12 DIAGNOSIS — Z0289 Encounter for other administrative examinations: Secondary | ICD-10-CM

## 2023-02-12 NOTE — Progress Notes (Signed)
Office: [phone]  /  Fax: [phone]   Initial Visit  Amanda Dillon was seen in clinic today to evaluate for obesity. She is interested in losing weight to improve overall health and reduce the risk of weight related complications. She presents today to review program treatment options, initial physical assessment, and evaluation.     She was referred by: Friend or Family  When asked what else they would like to accomplish? She states: Improve energy levels and physical activity, Improve existing medical conditions, Reduce number of medications, Improve quality of life, and Lose a target amount of weight : 60 lbs  Weight history:  She started gaining weight as a child.  Her weight has fluctuated over the years.    When asked how has your weight affected you? She states: Having fatigue and Having poor endurance  Some associated conditions: MDD, anxiety, PTSD, pre diabetes  Contributing factors: Family history, Medications, Life event, and Menopause  Weight promoting medications identified: None  Current nutrition plan: None  Current level of physical activity: None  Current or previous pharmacotherapy: None  Response to medication: Never tried medications   Past medical history includes:   Past Medical History:  Diagnosis Date   Adenomyosis 05/23/2003   Depression      Objective:   BP (!) 159/100   Pulse 68   Temp 98.4 F (36.9 C)   Ht 5\' 6"  (1.676 m)   Wt 224 lb (101.6 kg)   LMP  (LMP Unknown)   SpO2 96%   BMI 36.15 kg/m  She was weighed on the bioimpedance scale: Body mass index is 36.15 kg/m.  Peak Weight:225 lbd , Body Fat%:33.4, Visceral Fat Rating:9, Weight trend over the last 12 months: Increasing  General:  Alert, oriented and cooperative. Patient is in no acute distress.  Respiratory: Normal respiratory effort, no problems with respiration noted   Gait: able to ambulate independently  Mental Status: Normal mood and affect. Normal behavior.  Normal judgment and thought content.   DIAGNOSTIC DATA REVIEWED:  BMET    Component Value Date/Time   NA 131 (L) 04/09/2019 0842   K 3.5 04/09/2019 0842   CL 97 (L) 04/09/2019 0842   CO2 25 04/09/2019 0842   GLUCOSE 189 (H) 04/09/2019 0842   BUN 14 04/09/2019 0842   CREATININE 0.89 04/09/2019 0842   CALCIUM 8.2 (L) 04/09/2019 0842   GFRNONAA >60 04/09/2019 0842   GFRAA >60 04/09/2019 0842   Lab Results  Component Value Date   HGBA1C 5.2 04/09/2019   No results found for: "INSULIN" CBC    Component Value Date/Time   WBC 9.5 04/09/2019 0842   RBC 4.17 04/09/2019 0842   HGB 12.6 04/09/2019 0842   HCT 39.0 04/09/2019 0842   PLT 211 04/09/2019 0842   MCV 93.5 04/09/2019 0842   MCH 30.2 04/09/2019 0842   MCHC 32.3 04/09/2019 0842   RDW 13.2 04/09/2019 0842   Iron/TIBC/Ferritin/ %Sat No results found for: "IRON", "TIBC", "FERRITIN", "IRONPCTSAT" Lipid Panel  No results found for: "CHOL", "TRIG", "HDL", "CHOLHDL", "VLDL", "LDLCALC", "LDLDIRECT" Hepatic Function Panel     Component Value Date/Time   PROT 7.1 04/09/2019 0842   ALBUMIN 3.7 04/09/2019 0842   AST 17 04/09/2019 0842   ALT 16 04/09/2019 0842   ALKPHOS 59 04/09/2019 0842   BILITOT 0.8 04/09/2019 0842   No results found for: "TSH"   Assessment and Plan:   Elevated BP without diagnosis of hypertension Will check BP at work and will bring  to review at next visit or with PCP  Generalized obesity  BMI 36.0-36.9,adult        Obesity Treatment / Action Plan:  Patient will work on garnering support from family and friends to begin weight loss journey. Will work on eliminating or reducing the presence of highly palatable, calorie dense foods in the home. Will complete provided nutritional and psychosocial assessment questionnaire before the next appointment. Will be scheduled for indirect calorimetry to determine resting energy expenditure in a fasting state.  This will allow [place] to create a reduced  calorie, high-protein meal plan to promote loss of fat mass while preserving muscle mass. Counseled on the health benefits of losing 5%-15% of total body weight. Was counseled on nutritional approaches to weight loss and benefits of reducing processed foods and consuming plant-based foods and high quality protein as part of nutritional weight management. Was counseled on pharmacotherapy and role as an adjunct in weight management.   Obesity Education Performed Today:  She was weighed on the bioimpedance scale and results were discussed and documented in the synopsis.  We discussed obesity as a disease and the importance of a more detailed evaluation of all the factors contributing to the disease.  We discussed the importance of long term lifestyle changes which include nutrition, exercise and behavioral modifications as well as the importance of customizing this to her specific health and social needs.  We discussed the benefits of reaching a healthier weight to alleviate the symptoms of existing conditions and reduce the risks of the biomechanical, metabolic and psychological effects of obesity.  Amanda Dillon appears to be in the action stage of change and states they are ready to start intensive lifestyle modifications and behavioral modifications.  30 minutes was spent today on this visit including the above counseling, pre-visit chart review, and post-visit documentation.  Reviewed by clinician on day of visit: allergies, medications, problem list, medical history, surgical history, family history, social history, and previous encounter notes pertinent to obesity diagnosis.    Theodis Sato Jerrit Horen FNP-C

## 2023-02-26 ENCOUNTER — Telehealth: Payer: Self-pay

## 2023-02-28 ENCOUNTER — Encounter: Payer: Self-pay | Admitting: Bariatrics

## 2023-02-28 ENCOUNTER — Ambulatory Visit (INDEPENDENT_AMBULATORY_CARE_PROVIDER_SITE_OTHER): Payer: No Typology Code available for payment source | Admitting: Psychiatry

## 2023-02-28 ENCOUNTER — Ambulatory Visit: Payer: PRIVATE HEALTH INSURANCE | Admitting: Bariatrics

## 2023-02-28 VITALS — BP 144/87 | HR 74 | Temp 98.2°F | Ht 66.0 in | Wt 222.0 lb

## 2023-02-28 DIAGNOSIS — Z8659 Personal history of other mental and behavioral disorders: Secondary | ICD-10-CM | POA: Diagnosis not present

## 2023-02-28 DIAGNOSIS — F411 Generalized anxiety disorder: Secondary | ICD-10-CM | POA: Diagnosis not present

## 2023-02-28 DIAGNOSIS — E66812 Obesity, class 2: Secondary | ICD-10-CM | POA: Diagnosis not present

## 2023-02-28 DIAGNOSIS — F3341 Major depressive disorder, recurrent, in partial remission: Secondary | ICD-10-CM | POA: Diagnosis not present

## 2023-02-28 DIAGNOSIS — R7309 Other abnormal glucose: Secondary | ICD-10-CM | POA: Insufficient documentation

## 2023-02-28 DIAGNOSIS — E559 Vitamin D deficiency, unspecified: Secondary | ICD-10-CM

## 2023-02-28 DIAGNOSIS — Z1331 Encounter for screening for depression: Secondary | ICD-10-CM | POA: Diagnosis not present

## 2023-02-28 DIAGNOSIS — R0602 Shortness of breath: Secondary | ICD-10-CM

## 2023-02-28 DIAGNOSIS — R5383 Other fatigue: Secondary | ICD-10-CM | POA: Diagnosis not present

## 2023-02-28 DIAGNOSIS — E6609 Other obesity due to excess calories: Secondary | ICD-10-CM

## 2023-02-28 DIAGNOSIS — Z724 Inappropriate diet and eating habits: Secondary | ICD-10-CM | POA: Diagnosis not present

## 2023-02-28 DIAGNOSIS — Z6837 Body mass index (BMI) 37.0-37.9, adult: Secondary | ICD-10-CM

## 2023-02-28 DIAGNOSIS — Z0000 Encounter for general adult medical examination without abnormal findings: Secondary | ICD-10-CM | POA: Insufficient documentation

## 2023-02-28 DIAGNOSIS — Z638 Other specified problems related to primary support group: Secondary | ICD-10-CM
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
## 2023-02-28 NOTE — Progress Notes (Deleted)
e

## 2023-03-01 ENCOUNTER — Encounter: Payer: Self-pay | Admitting: Bariatrics

## 2023-03-01 DIAGNOSIS — E7800 Pure hypercholesterolemia, unspecified: Secondary | ICD-10-CM | POA: Insufficient documentation

## 2023-03-01 LAB — COMPREHENSIVE METABOLIC PANEL
ALT: 27 [IU]/L (ref 0–32)
AST: 24 [IU]/L (ref 0–40)
Albumin: 4.3 g/dL (ref 3.8–4.9)
Alkaline Phosphatase: 111 [IU]/L (ref 44–121)
BUN/Creatinine Ratio: 11 (ref 9–23)
BUN: 12 mg/dL (ref 6–24)
Bilirubin Total: 0.3 mg/dL (ref 0.0–1.2)
CO2: 23 mmol/L (ref 20–29)
Calcium: 9.7 mg/dL (ref 8.7–10.2)
Chloride: 104 mmol/L (ref 96–106)
Creatinine, Ser: 1.07 mg/dL — ABNORMAL HIGH (ref 0.57–1.00)
Globulin, Total: 2.7 g/dL (ref 1.5–4.5)
Glucose: 89 mg/dL (ref 70–99)
Potassium: 4 mmol/L (ref 3.5–5.2)
Sodium: 142 mmol/L (ref 134–144)
Total Protein: 7 g/dL (ref 6.0–8.5)
eGFR: 62 mL/min/{1.73_m2} (ref >59)

## 2023-03-01 LAB — HEMOGLOBIN A1C
Est. average glucose Bld gHb Est-mCnc: 114 mg/dL
Hgb A1c MFr Bld: 5.6 % (ref 4.8–5.6)

## 2023-03-01 LAB — LIPID PANEL WITH LDL/HDL RATIO
Cholesterol, Total: 212 mg/dL — ABNORMAL HIGH (ref 100–199)
HDL: 50 mg/dL (ref >39)
LDL Chol Calc (NIH): 137 mg/dL — ABNORMAL HIGH (ref 0–99)
LDL/HDL Ratio: 2.7 {ratio} (ref 0.0–3.2)
Triglycerides: 141 mg/dL (ref 0–149)
VLDL Cholesterol Cal: 25 mg/dL (ref 5–40)

## 2023-03-01 LAB — INSULIN, RANDOM: INSULIN: 11.1 u[IU]/mL (ref 2.6–24.9)

## 2023-03-01 LAB — VITAMIN D 25 HYDROXY (VIT D DEFICIENCY, FRACTURES): Vit D, 25-Hydroxy: 22.9 ng/mL — ABNORMAL LOW (ref 30.0–100.0)

## 2023-03-01 LAB — TSH: TSH: 2.34 u[IU]/mL (ref 0.450–4.500)

## 2023-03-05 NOTE — Progress Notes (Signed)
Chief Complaint:   OBESITY Amanda Dillon (MR# [id number]) is a 57 y.o. female who presents for evaluation and treatment of obesity and related comorbidities. Current BMI is Body mass index is 35.83 kg/m. Amanda Dillon has been struggling with her weight for many years and has been unsuccessful in either losing weight, maintaining weight loss, or reaching her healthy weight goal.  Amanda Dillon is currently in the action stage of change and ready to dedicate time achieving and maintaining a healthier weight. Amanda Dillon is interested in becoming our patient and working on intensive lifestyle modifications including (but not limited to) diet and exercise for weight loss.  Patient met with Judeth Cornfield, nurse practitioner for an information visit.  Amanda Dillon's habits were reviewed today and are as follows: her desired weight loss is 67-72 lbs, she has been heavy most of her life, she started gaining excessive weight as a teenager, her heaviest weight ever was 225 pounds, she has significant food cravings issues, she snacks frequently in the evenings, she is frequently drinking liquids with calories, she frequently makes poor food choices, she frequently eats larger portions than normal, and she struggles with emotional eating.  Depression Screen Tye's Food and Mood (modified PHQ-9) score was 13.  Subjective:   1. Other fatigue Amanda Dillon admits to daytime somnolence and admits to waking up still tired. Patient has a history of symptoms of daytime fatigue, morning fatigue, and morning headache. Amanda Dillon states that she has nightime awakenings and difficulty falling back asleep if awakened. Snoring is present. Apneic episodes are not present. Epworth Sleepiness Score is 3.   2. SOB (shortness of breath) on exertion Amanda Dillon notes increasing shortness of breath with exercising and seems to be worsening over time with weight gain. She notes getting out of breath sooner with activity than she used to. This has not gotten worse  recently. Amanda Dillon denies shortness of breath at rest or orthopnea.  3. Elevated glucose Patient denies a family history of diabetes mellitus.  4. Vitamin D deficiency Patient takes vitamin D intermittently.  5. Health care maintenance Given obesity.   Assessment/Plan:   1. Other fatigue Amanda Dillon does feel that her weight is causing her energy to be lower than it should be. Fatigue may be related to obesity, depression or many other causes. Labs will be ordered, and in the meanwhile, Amanda Dillon will focus on self care including making healthy food choices, increasing physical activity and focusing on stress reduction.  - EKG 12-Lead - TSH - Comprehensive metabolic panel  2. SOB (shortness of breath) on exertion Amanda Dillon does feel that she gets out of breath more easily that she used to when she exercises. Amanda Dillon's shortness of breath appears to be obesity related and exercise induced. She has agreed to work on weight loss and gradually increase exercise to treat her exercise induced shortness of breath. Will continue to monitor closely.  3. Elevated glucose We will check labs today, we will follow-up at patient's next office visit.  - Hemoglobin A1c - Insulin, random  4. Vitamin D deficiency We will check labs today, and we will follow-up at patient's next office visit.  - VITAMIN D 25 Hydroxy (Vit-D Deficiency, Fractures)  5. Health care maintenance We will check labs today. IC and EKG were done today and reviewed with the patient.   - Hemoglobin A1c - Insulin, random - Lipid Panel With LDL/HDL Ratio - VITAMIN D 25 Hydroxy (Vit-D Deficiency, Fractures) - TSH - Comprehensive metabolic panel  6. Depression screening Amanda Dillon had a  positive depression screening. Depression is commonly associated with obesity and often results in emotional eating behaviors. We will monitor this closely and work on CBT to help improve the non-hunger eating patterns. Referral to Psychology may be required if no  improvement is seen as she continues in our clinic.  7. Class 2 obesity due to excess calories without serious comorbidity with body mass index (BMI) of 37.0 to 37.9 in adult Amanda Dillon is currently in the action stage of change and her goal is to continue with weight loss efforts. I recommend Amanda Dillon begin the structured treatment plan as follows:  She has agreed to the Category 4 Plan -100 calories.  Patient will develop a strategy for being hungry before dinner (raw vegetables).  Meal planning was discussed.  Exercise goals: No exercise has been prescribed at this time.   Behavioral modification strategies: increasing lean protein intake, decreasing simple carbohydrates, increasing vegetables, increasing water intake, decreasing eating out, no skipping meals, meal planning and cooking strategies, keeping healthy foods in the home, and planning for success.  She was informed of the importance of frequent follow-up visits to maximize her success with intensive lifestyle modifications for her multiple health conditions. She was informed we would discuss her lab results at her next visit unless there is a critical issue that needs to be addressed sooner. Amanda Dillon agreed to keep her next visit at the agreed upon time to discuss these results.  Objective:   Blood pressure (!) 144/87, pulse 74, temperature 98.2 F (36.8 C), height 5\' 6"  (1.676 m), weight 222 lb (100.7 kg), SpO2 100%. Body mass index is 35.83 kg/m.  EKG: Normal sinus rhythm, rate 60 BPM.  Indirect Calorimeter completed today shows a VO2 of 348 and a REE of 2405.  Her calculated basal metabolic rate is 1610 thus her basal metabolic rate is better than expected.  General: Cooperative, alert, well developed, in no acute distress. HEENT: Conjunctivae and lids unremarkable. Cardiovascular: Regular rhythm.  Lungs: Normal work of breathing. Neurologic: No focal deficits.   Lab Results  Component Value Date   CREATININE 1.07 (H) 02/28/2023    BUN 12 02/28/2023   NA 142 02/28/2023   K 4.0 02/28/2023   CL 104 02/28/2023   CO2 23 02/28/2023   Lab Results  Component Value Date   ALT 27 02/28/2023   AST 24 02/28/2023   ALKPHOS 111 02/28/2023   BILITOT 0.3 02/28/2023   Lab Results  Component Value Date   HGBA1C 5.6 02/28/2023   HGBA1C 5.2 04/09/2019   Lab Results  Component Value Date   INSULIN 11.1 02/28/2023   Lab Results  Component Value Date   TSH 2.340 02/28/2023   Lab Results  Component Value Date   CHOL 212 (H) 02/28/2023   HDL 50 02/28/2023   LDLCALC 137 (H) 02/28/2023   TRIG 141 02/28/2023   Lab Results  Component Value Date   WBC 9.5 04/09/2019   HGB 12.6 04/09/2019   HCT 39.0 04/09/2019   MCV 93.5 04/09/2019   PLT 211 04/09/2019   No results found for: "IRON", "TIBC", "FERRITIN"  Attestation Statements:   Reviewed by clinician on day of visit: allergies, medications, problem list, medical history, surgical history, family history, social history, and previous encounter notes.   Trude Mcburney, am acting as Energy manager for [employer], DO.  I have reviewed the above documentation for accuracy and completeness, and I agree with the above. Corinna Capra, DO

## 2023-03-14 ENCOUNTER — Encounter: Payer: Self-pay | Admitting: Bariatrics

## 2023-03-14 ENCOUNTER — Ambulatory Visit: Payer: PRIVATE HEALTH INSURANCE | Admitting: Bariatrics

## 2023-03-14 VITALS — BP 147/88 | HR 55 | Temp 97.6°F | Ht 66.0 in | Wt 212.0 lb

## 2023-03-14 DIAGNOSIS — E88819 Insulin resistance, unspecified: Secondary | ICD-10-CM | POA: Insufficient documentation

## 2023-03-14 DIAGNOSIS — E66811 Obesity, class 1: Secondary | ICD-10-CM

## 2023-03-14 DIAGNOSIS — E7800 Pure hypercholesterolemia, unspecified: Secondary | ICD-10-CM

## 2023-03-14 DIAGNOSIS — Z6834 Body mass index (BMI) 34.0-34.9, adult: Secondary | ICD-10-CM

## 2023-03-14 DIAGNOSIS — E559 Vitamin D deficiency, unspecified: Secondary | ICD-10-CM

## 2023-03-14 DIAGNOSIS — R030 Elevated blood-pressure reading, without diagnosis of hypertension: Secondary | ICD-10-CM | POA: Diagnosis not present

## 2023-03-14 MED ORDER — VITAMIN D (ERGOCALCIFEROL) 1.25 MG (50000 UNIT) PO CAPS
50000.0000 [IU] | ORAL_CAPSULE | ORAL | 0 refills | Status: DC
Start: 2023-03-14 — End: 2023-05-17

## 2023-03-15 ENCOUNTER — Encounter: Payer: Self-pay | Admitting: Bariatrics

## 2023-03-15 NOTE — Progress Notes (Signed)
Chief Complaint:   OBESITY Amanda Dillon is here to discuss her progress with her obesity treatment plan along with follow-up of her obesity related diagnoses. Amanda Dillon is on the Category 4 Plan - 100 calories and states she is following her eating plan approximately 100% of the time. Amanda Dillon states she is walking for 50-60 minutes 3 times per week.  Today's visit was #: 2 Starting weight: 222 lbs Starting date: 02/28/2023 Today's weight: 212 lbs Today's date: 03/14/2023 Total lbs lost to date: 10 Total lbs lost since last in-office visit: 10  Interim History: Patient is down 10 lbs since her last visit. She is doing more water and decreasing diet Coke. She did not feel hungry most days.   Subjective:   1. Vitamin D deficiency Patient's recent vitamin D level was 22.9.  2. Elevated cholesterol Patient's total cholesterol was 212 and LDL 137.  She is not on medications.  3. Insulin resistance Patient's recent insulin level was 11.1 and A1c 5.6.  4. Elevated BP without diagnosis of hypertension Patient states she has whitecoat syndrome.  Assessment/Plan:   1. Vitamin D deficiency Patient agreed to start prescription vitamin D 50,000 IU once weekly with a 90-day supply.  - Vitamin D, Ergocalciferol, (DRISDOL) 1.25 MG (50000 UNIT) CAPS capsule; Take 1 capsule (50,000 Units total) by mouth every 7 (seven) days.  Dispense: 12 capsule; Refill: 0  2. Elevated cholesterol Healthy versus unhealthy fats and handout was given to the patient.  3. Insulin resistance We discussed insulin resistance, and we will follow-up over time.  4. Elevated BP without diagnosis of hypertension We will continue to follow, and patient will check her blood pressure at work.  5. Class 1 obesity due to excess calories without serious comorbidity with body mass index (BMI) of 34.0 to 34.9 in adult Amanda Dillon is currently in the action stage of change. As such, her goal is to continue with weight loss efforts. She  has agreed to the Category 4 Plan.   Meal planning and intentional eating were discussed.  Reviewed labs with the patient from 02/28/2023; CMP, lipids, and vitamin D.  Journaling numbers were given.  Exercise goals: As is.   Behavioral modification strategies: increasing lean protein intake, decreasing simple carbohydrates, increasing vegetables, increasing water intake, decreasing eating out, no skipping meals, meal planning and cooking strategies, keeping healthy foods in the home, and planning for success.  Amanda Dillon has agreed to follow-up with our clinic in 2 weeks. She was informed of the importance of frequent follow-up visits to maximize her success with intensive lifestyle modifications for her multiple health conditions.   Objective:   Blood pressure (!) 147/88, pulse (!) 55, temperature 97.6 F (36.4 C), height 5\' 6"  (1.676 m), weight 212 lb (96.2 kg), SpO2 100%. Body mass index is 34.22 kg/m.  General: Cooperative, alert, well developed, in no acute distress. HEENT: Conjunctivae and lids unremarkable. Cardiovascular: Regular rhythm.  Lungs: Normal work of breathing. Neurologic: No focal deficits.   Lab Results  Component Value Date   CREATININE 1.07 (H) 02/28/2023   BUN 12 02/28/2023   NA 142 02/28/2023   K 4.0 02/28/2023   CL 104 02/28/2023   CO2 23 02/28/2023   Lab Results  Component Value Date   ALT 27 02/28/2023   AST 24 02/28/2023   ALKPHOS 111 02/28/2023   BILITOT 0.3 02/28/2023   Lab Results  Component Value Date   HGBA1C 5.6 02/28/2023   HGBA1C 5.2 04/09/2019   Lab Results  Component Value Date   INSULIN 11.1 02/28/2023   Lab Results  Component Value Date   TSH 2.340 02/28/2023   Lab Results  Component Value Date   CHOL 212 (H) 02/28/2023   HDL 50 02/28/2023   LDLCALC 137 (H) 02/28/2023   TRIG 141 02/28/2023   Lab Results  Component Value Date   VD25OH 22.9 (L) 02/28/2023   Lab Results  Component Value Date   WBC 9.5 04/09/2019   HGB  12.6 04/09/2019   HCT 39.0 04/09/2019   MCV 93.5 04/09/2019   PLT 211 04/09/2019   No results found for: "IRON", "TIBC", "FERRITIN"  Attestation Statements:   Reviewed by clinician on day of visit: allergies, medications, problem list, medical history, surgical history, family history, social history, and previous encounter notes.   Trude Mcburney, am acting as Energy manager for [employer], DO.  I have reviewed the above documentation for accuracy and completeness, and I agree with the above. Corinna Capra, DO

## 2023-03-29 ENCOUNTER — Ambulatory Visit: Payer: PRIVATE HEALTH INSURANCE | Admitting: Bariatrics

## 2023-03-29 ENCOUNTER — Encounter: Payer: Self-pay | Admitting: Bariatrics

## 2023-03-29 VITALS — BP 137/79 | HR 62 | Temp 97.4°F | Ht 66.0 in | Wt 205.0 lb

## 2023-03-29 DIAGNOSIS — E6609 Other obesity due to excess calories: Secondary | ICD-10-CM

## 2023-03-29 DIAGNOSIS — E88819 Insulin resistance, unspecified: Secondary | ICD-10-CM

## 2023-03-29 DIAGNOSIS — E669 Obesity, unspecified: Secondary | ICD-10-CM | POA: Diagnosis not present

## 2023-03-29 DIAGNOSIS — Z6833 Body mass index (BMI) 33.0-33.9, adult: Secondary | ICD-10-CM

## 2023-03-29 NOTE — Progress Notes (Signed)
WEIGHT SUMMARY AND BIOMETRICS  Weight Lost Since Last Visit: 7lb  Weight Gained Since Last Visit: 0   Vitals Temp: (!) 97.4 F (36.3 C) BP: 137/79 Pulse Rate: 62 SpO2: 97 %   Anthropometric Measurements Height: 5\' 6"  (1.676 m) Weight: 205 lb (93 kg) BMI (Calculated): 33.1 Weight at Last Visit: 212lb Weight Lost Since Last Visit: 7lb Weight Gained Since Last Visit: 0 Starting Weight: 222lb Total Weight Loss (lbs): 17 lb (7.711 kg)   Body Composition  Body Fat %: 30.9 % Fat Mass (lbs): 63.4 lbs Muscle Mass (lbs): 134.8 lbs Total Body Water (lbs): 81.4 lbs Visceral Fat Rating : 8   Other Clinical Data Fasting: no Labs: no Today's Visit #: 3 Starting Date: 02/28/23    OBESITY Kelechi is here to discuss her progress with her obesity treatment plan along with follow-up of her obesity related diagnoses.    Nutrition Plan: the Category 4 plan - 100% adherence.  Current exercise: walking  Interim History:  She is down 7 lbs since her last visit.  Eating all of the food on the plan., Protein intake is as prescribed, Is not drinking sugar sweetened beverages., Is not skipping meals, Not journaling consistently., Water intake is adequate., and Denies polyphagia   Hunger is moderately controlled.  Cravings are moderately controlled.  Assessment/Plan:   Insulin Resistance Shaune has had elevated fasting insulin readings. Goal is HgbA1c < 5.7, fasting insulin at l0 or less, and preferably at 5.  She denies polyphagia. Medication(s): none Lab Results  Component Value Date   HGBA1C 5.6 02/28/2023   Lab Results  Component Value Date   INSULIN 11.1 02/28/2023    Plan Will work on the agreed upon plan. Will minimize refined carbohydrates ( sweets and starches), and focus more on complex carbohydrates.  Increase the micronutrients found in leafy greens,  which include magnesium, polyphenols, and vitamin C which have been postulated to help with insulin sensitivity. Minimize "fast food" and cook more meals at home.  Will continue journaling.  Increase fiber to 25 to 30 grams daily. (High fiber sheet) Information sheet for "Smart Fruit" Information sheet for " Starchy and Non-starchy vegetables.      Generalized Obesity: Current BMI BMI (Calculated): 33.1    Kellsey is currently in the action stage of change. As such, her goal is to continue with weight loss efforts.  She has agreed to the Category 4 plan.  Exercise goals: For substantial health benefits, adults should do at least 150 minutes (2 hours and 30 minutes) a week of moderate-intensity, or 75 minutes (1 hour and 15 minutes) a week of vigorous-intensity aerobic physical activity, or an equivalent combination of moderate- and vigorous-intensity aerobic activity. Aerobic activity should be performed in episodes of at least 10 minutes, and preferably, it should be spread throughout the week. She is walking about 3 x a week.  She will start to run at the weight 200 lbs.   Behavioral modification strategies: increasing lean protein intake, decreasing simple carbohydrates , meal planning , better snacking choices, planning for success, increasing vegetables, increasing fiber rich foods, get rid of junk food in the home, avoiding temptations, keep healthy foods in the home, work on smaller portions, and mindful eating.  Davion has agreed to follow-up with our clinic in 4 weeks.     Objective:   VITALS: Per patient if applicable, see vitals. GENERAL: Alert and in no acute distress. CARDIOPULMONARY: No increased WOB. Speaking in clear sentences.  PSYCH: Pleasant and cooperative. Speech normal rate and rhythm. Affect is appropriate. Insight and judgement are appropriate. Attention is focused, linear, and appropriate.  NEURO: Oriented as arrived to appointment on time with no prompting.    Attestation Statements:   This was prepared with the assistance of Engineer, civil (consulting).  Occasional wrong-word or sound-a-like substitutions may have occurred due to the inherent limitations of voice recognition   Corinna Capra, DO

## 2023-03-30 ENCOUNTER — Telehealth: Payer: Self-pay

## 2023-04-04 ENCOUNTER — Ambulatory Visit (INDEPENDENT_AMBULATORY_CARE_PROVIDER_SITE_OTHER): Payer: No Typology Code available for payment source | Admitting: Psychiatry

## 2023-04-04 DIAGNOSIS — Z634 Disappearance and death of family member: Secondary | ICD-10-CM

## 2023-04-04 DIAGNOSIS — F411 Generalized anxiety disorder: Secondary | ICD-10-CM | POA: Diagnosis not present

## 2023-04-04 DIAGNOSIS — Z8659 Personal history of other mental and behavioral disorders: Secondary | ICD-10-CM | POA: Diagnosis not present

## 2023-04-04 DIAGNOSIS — Z724 Inappropriate diet and eating habits: Secondary | ICD-10-CM

## 2023-04-04 DIAGNOSIS — F3341 Major depressive disorder, recurrent, in partial remission: Secondary | ICD-10-CM | POA: Diagnosis not present
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
## 2023-04-09 ENCOUNTER — Encounter: Payer: Self-pay | Admitting: Physician Assistant

## 2023-04-09 ENCOUNTER — Ambulatory Visit (INDEPENDENT_AMBULATORY_CARE_PROVIDER_SITE_OTHER): Payer: No Typology Code available for payment source | Admitting: Physician Assistant

## 2023-04-09 DIAGNOSIS — F411 Generalized anxiety disorder: Secondary | ICD-10-CM

## 2023-04-09 DIAGNOSIS — F4310 Post-traumatic stress disorder, unspecified: Secondary | ICD-10-CM | POA: Diagnosis not present

## 2023-04-09 DIAGNOSIS — F515 Nightmare disorder: Secondary | ICD-10-CM | POA: Diagnosis not present

## 2023-04-09 DIAGNOSIS — F3341 Major depressive disorder, recurrent, in partial remission: Secondary | ICD-10-CM | POA: Diagnosis not present

## 2023-04-09 MED ORDER — PRAZOSIN HCL 5 MG PO CAPS: 5.0000 mg | ORAL_CAPSULE | Freq: Every day | ORAL | 1 refills | Status: DC

## 2023-04-09 MED ORDER — PRAZOSIN HCL 1 MG PO CAPS: 1.0000 mg | ORAL_CAPSULE | Freq: Every day | ORAL | 1 refills | Status: DC

## 2023-04-09 MED ORDER — AUVELITY 45-105 MG PO TBCR: EXTENDED_RELEASE_TABLET | ORAL | 1 refills | Status: DC

## 2023-04-09 MED ORDER — VIIBRYD 40 MG PO TABS: 40.0000 mg | ORAL_TABLET | Freq: Every day | ORAL | 1 refills | Status: DC

## 2023-04-09 NOTE — Progress Notes (Unsigned)
[facility]  Patient ID: Amanda Dillon,  MRN: [id number]  PCP: Amanda James, MD  Date of Evaluation: 04/09/2023  time spent:25 minutes  Chief Complaint:  Chief Complaint   Depression; Follow-up    HISTORY/CURRENT STATUS: HPI For routine med check.  [name] is doing well overall.  She had to put her last living dog to sleep recently and that has been hard.  Work is going well.  She is able to enjoy things.  Energy and motivation are good most of the time.  She sleeps well.  No reports of nightmares.  Appetite is normal and weight is stable.  ADLs and personal hygiene are normal.  She does not cry easily.  No feelings of hopelessness.  No anxiety to speak of, no panic attacks.  Denies suicidal or homicidal thoughts.  Patient denies increased energy with decreased need for sleep, increased talkativeness, racing thoughts, impulsivity or risky behaviors, increased spending, increased libido, grandiosity, increased irritability or anger, paranoia, or hallucinations.  Denies dizziness, syncope, seizures, numbness, tingling, tremor, tics, unsteady gait, slurred speech, confusion. Denies muscle or joint pain, stiffness, or dystonia. Denies unexplained weight loss, frequent infections, or sores that heal slowly.  No polyphagia, polydipsia, or polyuria. Denies visual changes or paresthesias.   Individual Medical History/ Review of Systems: Changes? :No   Past medications for mental health diagnoses include: Uncertain.  She has taken a higher dose of Wellbutrin XL which did help but she has not needed that strength for a while. Abilify caused wt gain.  Allergies: Patient has no known allergies.  Current Medications:  Current Outpatient Medications:    estradiol (ESTRACE) 2 MG tablet, Take 1 tablet by mouth daily., Disp: , Rfl:    progesterone (PROMETRIUM) 100 MG capsule, Take 100 mg by mouth at bedtime., Disp: , Rfl:    Vitamin D, Ergocalciferol, (DRISDOL) 1.25 MG (50000 UNIT) CAPS  capsule, Take 1 capsule (50,000 Units total) by mouth every 7 (seven) days., Disp: 12 capsule, Rfl: 0   Dextromethorphan-buPROPion ER (AUVELITY) 45-105 MG TBCR, TAKE ONE (1) TABLET BY MOUTH TWICE DAILY, Disp: 180 tablet, Rfl: 1   prazosin (MINIPRESS) 1 MG capsule, Take 1 capsule (1 mg total) by mouth at bedtime., Disp: 90 capsule, Rfl: 1   prazosin (MINIPRESS) 5 MG capsule, Take 1 capsule (5 mg total) by mouth at bedtime., Disp: 90 capsule, Rfl: 1   VIIBRYD 40 MG TABS, Take 1 tablet (40 mg total) by mouth daily., Disp: 90 tablet, Rfl: 1 Medication Side Effects: none  Family Medical/ Social History: Changes? Yes, her dog  MENTAL HEALTH EXAM:  There were no vitals taken for this visit.There is no height or weight on file to calculate BMI.  General Appearance: Casual and Well Groomed  Eye Contact:  Good  Speech:  Clear and Coherent and Normal Rate  Volume:  Normal  Mood:  Euthymic  Affect:  Congruent  Thought Process:  Goal Directed and Descriptions of Associations: Circumstantial  Orientation:  Full (Time, Place, and Person)  Thought Content: Logical   Suicidal Thoughts:  No  Homicidal Thoughts:  No  Memory:  WNL  Judgement:  Good  Insight:  Good  Psychomotor Activity:  Normal  Concentration:  Concentration: Good and Attention Span: Good  Recall:  Good  Fund of Knowledge: Good  Language: Good  Assets:  Communication Skills Desire for Improvement Financial Resources/Insurance Housing Resilience Transportation Vocational/Educational  ADL's:  Intact  Cognition: WNL  Prognosis:  Good   DIAGNOSES:    ICD-10-CM  1. Recurrent major depressive disorder, in partial remission (HCC)  F33.41     2. Generalized anxiety disorder  F41.1     3. PTSD (post-traumatic stress disorder) - stable  F43.10     4. Nightmares  F51.5       Receiving Psychotherapy: No With Dr. Marliss Dillon in the past  RECOMMENDATIONS:  PDMP was reviewed.  No controlled substances. I provided 25 minutes  of face to face time during this encounter, including time spent before and after the visit in records review, medical decision making, counseling pertinent to today's visit, and charting.   My condolences in the loss of her dog.  She is doing well as far as her mental health medications go so no changes will be made.  Continue Auvelity 45-105 mg, 1 p.o. twice daily. Continue prazosin  6 mg p.o. nightly. Continue Viibryd 40 mg, 1 p.o. daily. (Must be brand. Generic is NOT effective.) Continue vitamin D 50,000 IUs, 1 p.o. weekly. Continue therapy with Dr. Marliss Dillon.  Return in 6 months.  Amanda Overly, PA-C

## 2023-04-23 ENCOUNTER — Ambulatory Visit: Payer: PRIVATE HEALTH INSURANCE | Admitting: Bariatrics

## 2023-04-23 ENCOUNTER — Encounter: Payer: Self-pay | Admitting: Bariatrics

## 2023-04-23 VITALS — BP 138/88 | HR 65 | Ht 66.0 in | Wt 199.0 lb

## 2023-04-23 DIAGNOSIS — E66811 Obesity, class 1: Secondary | ICD-10-CM

## 2023-04-23 DIAGNOSIS — E669 Obesity, unspecified: Secondary | ICD-10-CM | POA: Diagnosis not present

## 2023-04-23 DIAGNOSIS — Z6832 Body mass index (BMI) 32.0-32.9, adult: Secondary | ICD-10-CM

## 2023-04-23 DIAGNOSIS — E88819 Insulin resistance, unspecified: Secondary | ICD-10-CM

## 2023-04-23 NOTE — Progress Notes (Unsigned)
WEIGHT SUMMARY AND BIOMETRICS  Weight Lost Since Last Visit: 6lb  Weight Gained Since Last Visit: 0   Vitals BP: 138/88 Pulse Rate: 65 SpO2: 94 %   Anthropometric Measurements Height: 5\' 6"  (1.676 m) Weight: 199 lb (90.3 kg) BMI (Calculated): 32.13 Weight at Last Visit: 205lb Weight Lost Since Last Visit: 6lb Weight Gained Since Last Visit: 0 Starting Weight: 222lb Total Weight Loss (lbs): 23 lb (10.4 kg)   Body Composition  Body Fat %: 41.9 % Fat Mass (lbs): 83.4 lbs Muscle Mass (lbs): 110 lbs Total Body Water (lbs): 78 lbs Visceral Fat Rating : 10   Other Clinical Data Fasting: no Labs: no Today's Visit #: 4 Starting Date: 02/28/23    OBESITY Keslyn is here to discuss her progress with her obesity treatment plan along with follow-up of her obesity related diagnoses.    Nutrition Plan: the Category 4 plan - 90% adherence.  Current exercise: walking  Interim History:  She is down 6 lbs since her last visit.  Eating all of the food on the plan., Protein intake is as prescribed, Is not skipping meals, and Water intake is inadequate.   Pharmacotherapy: Imani is on {dwwpharmacotherapy:29109} Adverse side effects: {dwwse:29122} Hunger is {EWCONTROLASSESSMENT:24261}.  Cravings are {EWCONTROLASSESSMENT:24261}.  Assessment/Plan:   Insulin Resistance Jazmynne has had elevated fasting insulin readings. Goal is HgbA1c < 5.7, fasting insulin at l0 or less, and preferably at 5.  She reports *** denies polyphagia. Medication(s): *** Lab Results  Component Value Date   HGBA1C 5.6 02/28/2023   Lab Results  Component Value Date   INSULIN 11.1 02/28/2023    Plan Medication(s): {dwwpharmacotherapy:29109} Will work on the agreed upon plan. Will minimize refined carbohydrates ( sweets and starches), and focus more on complex carbohydrates.  Increase  the micronutrients found in leafy greens, which include magnesium, polyphenols, and vitamin C which have been postulated to help with insulin sensitivity. Minimize "fast food" and cook more meals at home.  Increase fiber to 25 to 30 grams daily.  Information sheet on " Insulin Resistance and Prediabetes".     Generalized Obesity: Current BMI BMI (Calculated): 32.13   Pharmacotherapy Plan {dwwmed:29123}  {dwwpharmacotherapy:29109}  Monice {CHL AMB IS/IS NOT:210130109} currently in the action stage of change. As such, her goal is to {MWMwtloss#1:210800005}.  She has agreed to {dwwsldiets:29085}.  Exercise goals: For substantial health benefits, adults should do at least 150 minutes (2 hours and 30 minutes) a week of moderate-intensity, or 75 minutes (1 hour and 15 minutes) a week of vigorous-intensity aerobic physical activity, or an equivalent combination of moderate- and vigorous-intensity aerobic activity. Aerobic activity should be performed in episodes of at least 10 minutes, and preferably, it should be spread throughout the week. She will slowly start to run. She will start some HIIT exercise.   Behavioral modification strategies: increasing lean protein intake, no meal skipping, meal  planning , better snacking choices, planning for success, increasing vegetables, increasing fiber rich foods, decrease snacking , avoiding temptations, keep healthy foods in the home, measure portion sizes, and mindful eating.  Ashlay has agreed to follow-up with our clinic in 3 weeks.      Objective:   VITALS: Per patient if applicable, see vitals. GENERAL: Alert and in no acute distress. CARDIOPULMONARY: No increased WOB. Speaking in clear sentences.  PSYCH: Pleasant and cooperative. Speech normal rate and rhythm. Affect is appropriate. Insight and judgement are appropriate. Attention is focused, linear, and appropriate.  NEURO: Oriented as arrived to appointment on time with no prompting.    Attestation Statements:   This was prepared with the assistance of Engineer, civil (consulting).  Occasional wrong-word or sound-a-like substitutions may have occurred due to the inherent limitations of voice recognition

## 2023-05-09 ENCOUNTER — Ambulatory Visit (INDEPENDENT_AMBULATORY_CARE_PROVIDER_SITE_OTHER): Payer: No Typology Code available for payment source | Admitting: Psychiatry

## 2023-05-09 DIAGNOSIS — F3341 Major depressive disorder, recurrent, in partial remission: Secondary | ICD-10-CM

## 2023-05-09 DIAGNOSIS — Z8659 Personal history of other mental and behavioral disorders: Secondary | ICD-10-CM

## 2023-05-09 DIAGNOSIS — Z724 Inappropriate diet and eating habits: Secondary | ICD-10-CM

## 2023-05-09 DIAGNOSIS — Z634 Disappearance and death of family member: Secondary | ICD-10-CM

## 2023-05-09 DIAGNOSIS — F411 Generalized anxiety disorder: Secondary | ICD-10-CM | POA: Diagnosis not present

## 2023-05-09 DIAGNOSIS — Z566 Other physical and mental strain related to work: Secondary | ICD-10-CM
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
## 2023-05-17 ENCOUNTER — Ambulatory Visit: Payer: PRIVATE HEALTH INSURANCE | Admitting: Bariatrics

## 2023-05-17 ENCOUNTER — Encounter: Payer: Self-pay | Admitting: Bariatrics

## 2023-05-17 VITALS — BP 133/81 | HR 62 | Temp 97.5°F | Ht 66.0 in | Wt 195.0 lb

## 2023-05-17 DIAGNOSIS — E6609 Other obesity due to excess calories: Secondary | ICD-10-CM

## 2023-05-17 DIAGNOSIS — Z6831 Body mass index (BMI) 31.0-31.9, adult: Secondary | ICD-10-CM | POA: Diagnosis not present

## 2023-05-17 DIAGNOSIS — E559 Vitamin D deficiency, unspecified: Secondary | ICD-10-CM | POA: Diagnosis not present

## 2023-05-17 DIAGNOSIS — E88819 Insulin resistance, unspecified: Secondary | ICD-10-CM | POA: Diagnosis not present

## 2023-05-17 DIAGNOSIS — E669 Obesity, unspecified: Secondary | ICD-10-CM | POA: Diagnosis not present

## 2023-05-17 MED ORDER — VITAMIN D (ERGOCALCIFEROL) 1.25 MG (50000 UNIT) PO CAPS
50000.0000 [IU] | ORAL_CAPSULE | ORAL | 0 refills | Status: DC
Start: 2023-05-17 — End: 2023-08-08

## 2023-05-17 NOTE — Progress Notes (Signed)
WEIGHT SUMMARY AND BIOMETRICS  Weight Lost Since Last Visit: 5lb  Weight Gained Since Last Visit: 0   Vitals Temp: (!) 97.5 F (36.4 C) BP: 133/81 Pulse Rate: 62 SpO2: 95 %   Anthropometric Measurements Height: 5\' 6"  (1.676 m) Weight: 195 lb (88.5 kg) BMI (Calculated): 31.49 Weight at Last Visit: 199lb Weight Lost Since Last Visit: 5lb Weight Gained Since Last Visit: 0 Starting Weight: 222lb Total Weight Loss (lbs): 27 lb (12.2 kg)   Body Composition  Body Fat %: 41.7 % Fat Mass (lbs): 81.6 lbs Muscle Mass (lbs): 108.4 lbs Total Body Water (lbs): 77.8 lbs Visceral Fat Rating : 10   Other Clinical Data Fasting: no Labs: no Today's Visit #: 5 Starting Date: 02/28/23    OBESITY Amanda Dillon is here to discuss her progress with her obesity treatment plan along with follow-up of her obesity related diagnoses.    Nutrition Plan: the Category 4 plan - 90% adherence.  Current exercise: walking  Interim History:  She is down 4 lbs since her last visit which was over the holiday.  Eating all of the food on the plan., Protein intake is as prescribed, Is not skipping meals, Not journaling consistently., and Water intake is inadequate.  Hunger is moderately controlled.  Cravings are moderately controlled.  Assessment/Plan:   1. Vitamin D deficiency Vitamin D Deficiency Vitamin D is not at goal of 50.  Most recent vitamin D level was 22.9. She is on  prescription ergocalciferol 50,000 IU weekly. Lab Results  Component Value Date   VD25OH 22.9 (L) 02/28/2023    Plan: Refill prescription vitamin D 50,000 IU weekly.   Insulin Resistance Amanda Dillon has had elevated fasting insulin readings. Goal is HgbA1c < 5.7, fasting insulin at l0 or less, and preferably at 5.  She denies polyphagia. Medication(s): none Lab Results  Component Value Date   HGBA1C 5.6  02/28/2023   Lab Results  Component Value Date   INSULIN 11.1 02/28/2023    Plan Will work on the agreed upon plan. Will minimize refined carbohydrates ( sweets and starches), and focus more on complex carbohydrates.  Increase the micronutrients found in leafy greens, which include magnesium, polyphenols, and vitamin C which have been postulated to help with insulin sensitivity. Minimize "fast food" and cook more meals at home.  Increase fiber to 25 to 30 grams daily.  Will continue to prepare meals at home.    Generalized Obesity: Current BMI BMI (Calculated): 31.49    Amanda Dillon is currently in the action stage of change. As such, her goal is to continue with weight loss efforts.  She has agreed to the Category 4 plan.  Exercise goals: All adults should avoid inactivity. Some physical activity is better than none, and adults who participate in any amount of physical activity gain some health benefits.  Behavioral modification strategies: increasing lean protein intake, meal  planning , better snacking choices, planning for success, increasing vegetables, avoiding temptations, and measure portion sizes.  Amanda Dillon has agreed to follow-up with our clinic in 3 weeks.      Objective:   VITALS: Per patient if applicable, see vitals. GENERAL: Alert and in no acute distress. CARDIOPULMONARY: No increased WOB. Speaking in clear sentences.  PSYCH: Pleasant and cooperative. Speech normal rate and rhythm. Affect is appropriate. Insight and judgement are appropriate. Attention is focused, linear, and appropriate.  NEURO: Oriented as arrived to appointment on time with no prompting.   Attestation Statements:    This was prepared with the assistance of Engineer, civil (consulting).  Occasional wrong-word or sound-a-like substitutions may have occurred due to the inherent limitations of voice recognition   Corinna Capra, DO

## 2023-06-12 ENCOUNTER — Encounter: Payer: Self-pay | Admitting: Bariatrics

## 2023-06-12 ENCOUNTER — Ambulatory Visit: Payer: PRIVATE HEALTH INSURANCE | Admitting: Bariatrics

## 2023-06-12 VITALS — BP 127/84 | HR 61 | Temp 97.5°F | Ht 66.0 in | Wt 190.0 lb

## 2023-06-12 DIAGNOSIS — E88819 Insulin resistance, unspecified: Secondary | ICD-10-CM

## 2023-06-12 DIAGNOSIS — E669 Obesity, unspecified: Secondary | ICD-10-CM | POA: Diagnosis not present

## 2023-06-12 DIAGNOSIS — E6609 Other obesity due to excess calories: Secondary | ICD-10-CM

## 2023-06-12 DIAGNOSIS — Z6830 Body mass index (BMI) 30.0-30.9, adult: Secondary | ICD-10-CM | POA: Diagnosis not present

## 2023-06-12 NOTE — Progress Notes (Unsigned)
WEIGHT SUMMARY AND BIOMETRICS  Weight Lost Since Last Visit: 5lb  Weight Gained Since Last Visit: 2   Vitals Temp: (!) 97.5 F (36.4 C) BP: 127/84 Pulse Rate: 61 SpO2: 95 %   Anthropometric Measurements Height: 5\' 6"  (1.676 m) Weight: 190 lb (86.2 kg) BMI (Calculated): 30.68 Weight at Last Visit: 195lb Weight Lost Since Last Visit: 5lb Weight Gained Since Last Visit: 2 Starting Weight: 222lb Total Weight Loss (lbs): 32 lb (14.5 kg)   Body Composition  Body Fat %: 40 % Fat Mass (lbs): 76.2 lbs Muscle Mass (lbs): 108.4 lbs Total Body Water (lbs): 76.2 lbs Visceral Fat Rating : 10   Other Clinical Data Fasting: no Labs: no Today's Visit #: 6 Starting Date: 02/28/23    OBESITY Breion is here to discuss her progress with her obesity treatment plan along with follow-up of her obesity related diagnoses.    Nutrition Plan: the Category 4 plan - 60% adherence.  Current exercise: walking  Interim History:  She is down 5 lbs since her last visit. She has been on vacation. She is back on track since she got back home.  Eating all of the food on the plan., Protein intake is as prescribed, Is not skipping meals, and Water intake is inadequate.   Hunger is well controlled.  Cravings are moderately controlled.  Assessment/Plan:   Insulin Resistance Laasia has had elevated fasting insulin readings. Goal is HgbA1c < 5.7, fasting insulin at l0 or less, and preferably at 5.  She denies polyphagia. Medication(s):  Lab Results  Component Value Date   HGBA1C 5.6 02/28/2023   Lab Results  Component Value Date   INSULIN 11.1 02/28/2023    Plan See is thinking about joining a gym  (bike or walking, and will consider a class) Will work on the agreed upon plan. Will minimize refined carbohydrates ( sweets and starches), and focus more on complex  carbohydrates.  Increase the micronutrients found in leafy greens, which include magnesium, polyphenols, and vitamin C which have been postulated to help with insulin sensitivity. Minimize "fast food" and cook more meals at home.  Increase fiber to 25 to 30 grams daily.    Generalized Obesity: Current BMI BMI (Calculated): 30.68    Adela {CHL AMB IS/IS NOT:[id number]} currently in the action stage of change. As such, her goal is to {MWMwtloss#1:[id number]}.  She has agreed to {dwwsldiets:29085}.  Exercise goals: For substantial health benefits, adults should do at least 150 minutes (2 hours and 30 minutes) a week of moderate-intensity, or 75 minutes (1 hour and 15 minutes) a week of vigorous-intensity aerobic physical activity, or an equivalent combination of moderate- and vigorous-intensity aerobic activity. Aerobic activity should be performed in episodes of at least 10 minutes, and preferably, it should be spread throughout the week.  Behavioral modification strategies: {dwwslwtlossstrategies:29088}.  Allean has agreed to follow-up with our clinic  in 4 weeks.       Objective:   VITALS: Per patient if applicable, see vitals. GENERAL: Alert and in no acute distress. CARDIOPULMONARY: No increased WOB. Speaking in clear sentences.  PSYCH: Pleasant and cooperative. Speech normal rate and rhythm. Affect is appropriate. Insight and judgement are appropriate. Attention is focused, linear, and appropriate.  NEURO: Oriented as arrived to appointment on time with no prompting.   Attestation Statements:    This was prepared with the assistance of Engineer, civil (consulting).  Occasional wrong-word or sound-a-like substitutions may have occurred due to the inherent limitations of voice recognition   Corinna Capra, DO

## 2023-06-13 ENCOUNTER — Telehealth: Payer: Self-pay

## 2023-06-13 NOTE — Telephone Encounter (Signed)
Received a prior authorization request for Auvelity 45-105 mg with [facility] from [facility], unable to submit, error noted.  Will try to contact [employer]

## 2023-06-15 NOTE — Telephone Encounter (Signed)
Contacted [facility] [phone]

## 2023-06-15 NOTE — Telephone Encounter (Signed)
PA approved Effective:  thru 06/14/24.

## 2023-06-15 NOTE — Telephone Encounter (Signed)
Contacted [facility] at [phone] they gave a different phone # at [phone] [facility] with [facility]. Representative set up again for [facility] but it still didn't process so it was completed over the phone. They asked for additional records be faxed to [phone] with [id number]  Auvelity 45-105 mg #60 for 30 day.

## 2023-07-04 ENCOUNTER — Ambulatory Visit (INDEPENDENT_AMBULATORY_CARE_PROVIDER_SITE_OTHER): Payer: No Typology Code available for payment source | Admitting: Psychiatry

## 2023-07-04 ENCOUNTER — Ambulatory Visit: Payer: PRIVATE HEALTH INSURANCE | Admitting: Bariatrics

## 2023-07-04 DIAGNOSIS — F3341 Major depressive disorder, recurrent, in partial remission: Secondary | ICD-10-CM

## 2023-07-04 DIAGNOSIS — Z8659 Personal history of other mental and behavioral disorders: Secondary | ICD-10-CM | POA: Diagnosis not present

## 2023-07-04 DIAGNOSIS — F411 Generalized anxiety disorder: Secondary | ICD-10-CM | POA: Diagnosis not present

## 2023-07-04 DIAGNOSIS — Z636 Dependent relative needing care at home: Secondary | ICD-10-CM

## 2023-07-04 DIAGNOSIS — Z638 Other specified problems related to primary support group: Secondary | ICD-10-CM
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
## 2023-07-12 ENCOUNTER — Ambulatory Visit: Payer: PRIVATE HEALTH INSURANCE | Admitting: Bariatrics

## 2023-07-12 ENCOUNTER — Encounter: Payer: Self-pay | Admitting: Bariatrics

## 2023-07-12 VITALS — BP 134/80 | HR 66 | Temp 98.4°F | Ht 66.0 in | Wt 183.0 lb

## 2023-07-12 DIAGNOSIS — E559 Vitamin D deficiency, unspecified: Secondary | ICD-10-CM

## 2023-07-12 DIAGNOSIS — E669 Obesity, unspecified: Secondary | ICD-10-CM | POA: Diagnosis not present

## 2023-07-12 DIAGNOSIS — Z6829 Body mass index (BMI) 29.0-29.9, adult: Secondary | ICD-10-CM | POA: Diagnosis not present

## 2023-07-12 NOTE — Progress Notes (Signed)
 WEIGHT SUMMARY AND BIOMETRICS  Weight Lost Since Last Visit: 7lb  Weight Gained Since Last Visit: 0lb   Vitals Temp: 98.4 F (36.9 C) BP: 134/80 Pulse Rate: 66 SpO2: 98 %   Anthropometric Measurements Height: 5\' 6"  (1.676 m) Weight: 183 lb (83 kg) BMI (Calculated): 29.55 Weight at Last Visit: 190lb Weight Lost Since Last Visit: 7lb Weight Gained Since Last Visit: 0lb Starting Weight: 222lb Total Weight Loss (lbs): 39 lb (17.7 kg)   Body Composition  Body Fat %: 39.6 % Fat Mass (lbs): 72.8 lbs Muscle Mass (lbs): 105.4 lbs Total Body Water (lbs): 73.8 lbs Visceral Fat Rating : 9   Other Clinical Data Fasting: No Labs: No Today's Visit #: 7 Starting Date: 02/28/23    OBESITY Amanda Dillon is here to discuss her progress with her obesity treatment plan along with follow-up of her obesity related diagnoses.    Nutrition Plan: the Category 4 plan - 65% adherence.  Current exercise: none  Interim History:  She is down another 7 lbs since her last visit. She has been sick since her last visit.  Eating all of the food on the plan., Protein intake is as prescribed, Is not skipping meals, Water intake is inadequate., and Denies polyphagia   Hunger is moderately controlled.  Cravings are moderately controlled.  Assessment/Plan:   Vitamin D Deficiency Vitamin D is not at goal of 50.  Most recent vitamin D level was 22.9. She is on  prescription ergocalciferol 50,000 IU weekly. Lab Results  Component Value Date   VD25OH 22.9 (L) 02/28/2023    Plan: Continue prescription vitamin D 50,000 IU weekly.  She will continue to eat healthy meals at home.     Generalized Obesity: Current BMI BMI (Calculated): 29.55   Amanda Dillon is currently in the action stage of change. As such, her goal is to continue with weight loss efforts.  She has agreed to the Category 4  plan.  Exercise goals: For substantial health benefits, adults should do at least 150 minutes (2 hours and 30 minutes) a week of moderate-intensity, or 75 minutes (1 hour and 15 minutes) a week of vigorous-intensity aerobic physical activity, or an equivalent combination of moderate- and vigorous-intensity aerobic activity. Aerobic activity should be performed in episodes of at least 10 minutes, and preferably, it should be spread throughout the week. She is going to start walking more with improved weather.   Behavioral modification strategies: increasing lean protein intake, no meal skipping, meal planning , increase water intake, better snacking choices, planning for success, keep healthy foods in the home, and weigh protein portions.  Amanda Dillon has agreed to follow-up with our clinic in 3 weeks for fasting labs.      Objective:   VITALS: Per patient if applicable, see vitals. GENERAL: Alert and in no acute distress. CARDIOPULMONARY: No increased WOB. Speaking in clear sentences.  PSYCH:  Pleasant and cooperative. Speech normal rate and rhythm. Affect is appropriate. Insight and judgement are appropriate. Attention is focused, linear, and appropriate.  NEURO: Oriented as arrived to appointment on time with no prompting.   Attestation Statements:   This was prepared with the assistance of Engineer, civil (consulting).  Occasional wrong-word or sound-a-like substitutions may have occurred due to the inherent limitations of voice recognition

## 2023-08-08 ENCOUNTER — Ambulatory Visit: Payer: PRIVATE HEALTH INSURANCE | Admitting: Bariatrics

## 2023-08-08 ENCOUNTER — Encounter: Payer: Self-pay | Admitting: Bariatrics

## 2023-08-08 VITALS — BP 121/81 | HR 63 | Temp 98.0°F | Ht 66.0 in | Wt 177.0 lb

## 2023-08-08 DIAGNOSIS — E7800 Pure hypercholesterolemia, unspecified: Secondary | ICD-10-CM | POA: Diagnosis not present

## 2023-08-08 DIAGNOSIS — E559 Vitamin D deficiency, unspecified: Secondary | ICD-10-CM

## 2023-08-08 DIAGNOSIS — E669 Obesity, unspecified: Secondary | ICD-10-CM | POA: Diagnosis not present

## 2023-08-08 DIAGNOSIS — E88819 Insulin resistance, unspecified: Secondary | ICD-10-CM

## 2023-08-08 DIAGNOSIS — Z6828 Body mass index (BMI) 28.0-28.9, adult: Secondary | ICD-10-CM

## 2023-08-08 MED ORDER — VITAMIN D (ERGOCALCIFEROL) 1.25 MG (50000 UNIT) PO CAPS
50000.0000 [IU] | ORAL_CAPSULE | ORAL | 0 refills | Status: DC
Start: 2023-08-08 — End: 2023-08-28

## 2023-08-08 NOTE — Progress Notes (Signed)
 WEIGHT SUMMARY AND BIOMETRICS  Weight Lost Since Last Visit: 6lb  Weight Gained Since Last Visit: 0lb   Vitals Temp: 98 F (36.7 C) BP: 121/81 Pulse Rate: 63 SpO2: 98 %   Anthropometric Measurements Height: 5\' 6"  (1.676 m) Weight: 177 lb (80.3 kg) BMI (Calculated): 28.58 Weight at Last Visit: 183lb Weight Lost Since Last Visit: 6lb Weight Gained Since Last Visit: 0lb Starting Weight: 222lb Total Weight Loss (lbs): 45 lb (20.4 kg)   Body Composition  Body Fat %: 26 % Fat Mass (lbs): 46.2 lbs Muscle Mass (lbs): 125 lbs Total Body Water (lbs): 72.2 lbs Visceral Fat Rating : 6   Other Clinical Data Fasting: Yes Labs: No Today's Visit #: 8 Starting Date: 02/28/23    OBESITY Sahiti is here to discuss her progress with her obesity treatment plan along with follow-up of her obesity related diagnoses.    Nutrition Plan: the Category 4 plan - 85% adherence.  Current exercise: walking  Interim History:  She is down 6 lbs since her last visit.  Eating all of the food on the plan., Protein intake is as prescribed, and Water intake is inadequate.  Hunger is moderately controlled.  Cravings are moderately controlled.  Assessment/Plan:   Vitamin D Deficiency Vitamin D is not at goal of 50.  Most recent vitamin D level was 22.9. She is on  prescription ergocalciferol 50,000 IU weekly. Lab Results  Component Value Date   VD25OH 22.9 (L) 02/28/2023    Plan: Refill prescription vitamin D 50,000 IU weekly.   Insulin Resistance Seleste has had elevated fasting insulin readings. Goal is HgbA1c < 5.7, fasting insulin at l0 or less, and preferably at 5.  She denies polyphagia. Lab Results  Component Value Date   HGBA1C 5.6 02/28/2023   Lab Results  Component Value Date   INSULIN 11.1 02/28/2023    Plan Medication(s): none Will work on the agreed  upon plan. Will minimize refined carbohydrates ( sweets and starches), and focus more on complex carbohydrates.  Increase the micronutrients found in leafy greens, which include magnesium, polyphenols, and vitamin C which have been postulated to help with insulin sensitivity. Minimize "fast food" and cook more meals at home.  Increase fiber to 25 to 30 grams daily.  She will increase her water.  She will increase her walking.   Elevated cholesterol:  She has a history of elevated cholesterol in the past.  She is not on any medication.  Plan: Will check a lipid panel.  Labs done today (CMP, Lipids, insulin, vitamin D, B 12).     Generalized Obesity: Current BMI BMI (Calculated): 28.58  Keirstin is currently in the action stage of change. As such, her goal is to continue with weight loss efforts.  She has agreed to the Category 4 plan.  Exercise goals: All adults should avoid inactivity. Some physical activity is  better than none, and adults who participate in any amount of physical activity gain some health benefits.  Behavioral modification strategies: increasing lean protein intake, decreasing simple carbohydrates , no meal skipping, meal planning , increase water intake, better snacking choices, planning for success, increasing vegetables, decrease junk food, keep healthy foods in the home, and mindful eating.  Hayslee has agreed to follow-up with our clinic in 4 weeks.     Objective:   VITALS: Per patient if applicable, see vitals. GENERAL: Alert and in no acute distress. CARDIOPULMONARY: No increased WOB. Speaking in clear sentences.  PSYCH: Pleasant and cooperative. Speech normal rate and rhythm. Affect is appropriate. Insight and judgement are appropriate. Attention is focused, linear, and appropriate.  NEURO: Oriented as arrived to appointment on time with no prompting.   Attestation Statements:    This was prepared with the assistance of Engineer, civil (consulting).   Occasional wrong-word or sound-a-like substitutions may have occurred due to the inherent limitations of voice recognition   Corinna Capra, DO

## 2023-08-10 LAB — COMPREHENSIVE METABOLIC PANEL
ALT: 16 IU/L (ref 0–32)
AST: 19 IU/L (ref 0–40)
Albumin: 4.6 g/dL (ref 3.8–4.9)
Alkaline Phosphatase: 93 IU/L (ref 44–121)
BUN/Creatinine Ratio: 21 (ref 9–23)
BUN: 19 mg/dL (ref 6–24)
Bilirubin Total: 0.4 mg/dL (ref 0.0–1.2)
CO2: 25 mmol/L (ref 20–29)
Calcium: 9.5 mg/dL (ref 8.7–10.2)
Chloride: 102 mmol/L (ref 96–106)
Creatinine, Ser: 0.91 mg/dL (ref 0.57–1.00)
Globulin, Total: 2.4 g/dL (ref 1.5–4.5)
Glucose: 79 mg/dL (ref 70–99)
Potassium: 4.3 mmol/L (ref 3.5–5.2)
Sodium: 141 mmol/L (ref 134–144)
Total Protein: 7 g/dL (ref 6.0–8.5)
eGFR: 75 mL/min/{1.73_m2} (ref >59)

## 2023-08-10 LAB — LIPID PANEL WITH LDL/HDL RATIO
Cholesterol, Total: 165 mg/dL (ref 100–199)
HDL: 49 mg/dL (ref >39)
LDL Chol Calc (NIH): 102 mg/dL — ABNORMAL HIGH (ref 0–99)
LDL/HDL Ratio: 2.1 ratio (ref 0.0–3.2)
Triglycerides: 72 mg/dL (ref 0–149)
VLDL Cholesterol Cal: 14 mg/dL (ref 5–40)

## 2023-08-10 LAB — INSULIN, RANDOM: INSULIN: 4.1 u[IU]/mL (ref 2.6–24.9)

## 2023-08-10 LAB — VITAMIN D 25 HYDROXY (VIT D DEFICIENCY, FRACTURES): Vit D, 25-Hydroxy: 80.5 ng/mL (ref 30.0–100.0)

## 2023-08-13 ENCOUNTER — Telehealth: Payer: Self-pay

## 2023-08-13 NOTE — Telephone Encounter (Signed)
-----   Message from Corinna Capra sent at 08/13/2023 11:04 AM EDT ----- Regarding: vitamin D Call pt. Her vitamin D is at a good level now. She can change her dose of vitamin D to every other week. ----- Message ----- From: Interface, [facility] Lab Results In Sent: 08/10/2023   5:38 AM EDT To: [place] Nickel, DO

## 2023-08-13 NOTE — Telephone Encounter (Signed)
 Left message for patient to return call. Will also send a MyChart message with results.

## 2023-08-28 ENCOUNTER — Ambulatory Visit: Payer: PRIVATE HEALTH INSURANCE | Admitting: Bariatrics

## 2023-08-28 ENCOUNTER — Encounter: Payer: Self-pay | Admitting: Bariatrics

## 2023-08-28 VITALS — BP 125/77 | HR 66 | Temp 97.4°F | Ht 66.0 in | Wt 175.0 lb

## 2023-08-28 DIAGNOSIS — E669 Obesity, unspecified: Secondary | ICD-10-CM

## 2023-08-28 DIAGNOSIS — E7800 Pure hypercholesterolemia, unspecified: Secondary | ICD-10-CM

## 2023-08-28 DIAGNOSIS — E559 Vitamin D deficiency, unspecified: Secondary | ICD-10-CM

## 2023-08-28 DIAGNOSIS — Z6828 Body mass index (BMI) 28.0-28.9, adult: Secondary | ICD-10-CM | POA: Diagnosis not present

## 2023-08-28 NOTE — Progress Notes (Signed)
 WEIGHT SUMMARY AND BIOMETRICS  Weight Lost Since Last Visit: 2lb  Weight Gained Since Last Visit: 0   Vitals Temp: (!) 97.4 F (36.3 C) BP: 125/77 Pulse Rate: 66 SpO2: 96 %   Anthropometric Measurements Height: 5\' 6"  (1.676 m) Weight: 175 lb (79.4 kg) BMI (Calculated): 28.26 Weight at Last Visit: 177lb Weight Lost Since Last Visit: 2lb Weight Gained Since Last Visit: 0 Starting Weight: 222lb Total Weight Loss (lbs): 47 lb (21.3 kg)   Body Composition  Body Fat %: 27 % Fat Mass (lbs): 47.4 lbs Muscle Mass (lbs): 122 lbs Total Body Water (lbs): 77 lbs Visceral Fat Rating : 6   Other Clinical Data Fasting: no Labs: no Today's Visit #: 9 Starting Date: 02/28/23    OBESITY Amanda Dillon is here to discuss her progress with her obesity treatment plan along with follow-up of her obesity related diagnoses.    Nutrition Plan: the Category 4 plan - 90% adherence.  Current exercise: none  Interim History:  She is down 2 lbs since her last visit.  Eating all of the food on the plan., Protein intake is as prescribed, and Water intake is inadequate. Hunger is well controlled.  Cravings are moderately controlled.  Assessment/Plan:   Vitamin D Deficiency Vitamin D is at goal of 50.  Most recent vitamin D level was 80.5. She is on  prescription ergocalciferol 50,000 IU weekly. Lab Results  Component Value Date   VD25OH 80.5 08/08/2023   VD25OH 22.9 (L) 02/28/2023    Plan: She will stop prescription vitamin D and will begin vitamin D 1,000 international units daily.    Elevated cholesterol:  Her cholesterol has been elevated in the past, but is improving.   Plan: Will continue to work on diet and exercise.  Will work on eating healthier fats.    Generalized Obesity: Current BMI BMI (Calculated): 28.26   Amanda Dillon is currently in the action stage of  change. As such, her goal is to continue with weight loss efforts.  She has agreed to the Category 4 plan.  Exercise goals: All adults should avoid inactivity. Some physical activity is better than none, and adults who participate in any amount of physical activity gain some health benefits.  Behavioral modification strategies: no meal skipping, decrease eating out, meal planning , better snacking choices, planning for success, increasing vegetables, increasing fiber rich foods, get rid of junk food in the home, avoiding temptations, and keep healthy foods in the home.  Amanda Dillon has agreed to follow-up with our clinic in 4 weeks.   Labs reviewed today (CMP, Lipids, HgbA1c, insulin, vitamin D, B 12, and thyroid panel).     Objective:   VITALS: Per patient if applicable, see vitals. GENERAL: Alert and in no acute distress. CARDIOPULMONARY: No increased WOB. Speaking in clear sentences.  PSYCH: Pleasant and cooperative. Speech normal rate and rhythm. Affect is  appropriate. Insight and judgement are appropriate. Attention is focused, linear, and appropriate.  NEURO: Oriented as arrived to appointment on time with no prompting.   Attestation Statements:   This was prepared with the assistance of Engineer, civil (consulting).  Occasional wrong-word or sound-a-like substitutions may have occurred due to the inherent limitations of voice recognition    Corinna Capra, DO

## 2023-08-29 ENCOUNTER — Ambulatory Visit (INDEPENDENT_AMBULATORY_CARE_PROVIDER_SITE_OTHER): Payer: No Typology Code available for payment source | Admitting: Psychiatry

## 2023-08-29 DIAGNOSIS — F411 Generalized anxiety disorder: Secondary | ICD-10-CM

## 2023-08-29 DIAGNOSIS — Z724 Inappropriate diet and eating habits: Secondary | ICD-10-CM

## 2023-08-29 DIAGNOSIS — F3341 Major depressive disorder, recurrent, in partial remission: Secondary | ICD-10-CM | POA: Diagnosis not present

## 2023-08-29 DIAGNOSIS — Z8659 Personal history of other mental and behavioral disorders: Secondary | ICD-10-CM | POA: Diagnosis not present

## 2023-08-29 DIAGNOSIS — Z636 Dependent relative needing care at home: Secondary | ICD-10-CM

## 2023-08-29 DIAGNOSIS — Z638 Other specified problems related to primary support group: Secondary | ICD-10-CM | POA: Diagnosis not present

## 2023-08-29 NOTE — Progress Notes (Signed)
 Psychotherapy Progress Note [facility] Delora Ferry, PhD LP  Patient ID: Amanda Dillon [facility])    MRN: [id number] Therapy format: Individual psychotherapy Date: 08/29/2023      Start: 5:08p     Stop: 5:54p     Time Spent: 46 min Location: In-person   Session narrative (presenting needs, interim history, self-report of stressors and symptoms, applications of prior therapy, status changes, and interventions made in session) Looking forward to her first-ever big vacation without family, with a friend, to [place] .  Optimistic about new boss as well, seems like a perceptive and benevolent woman who is already making changes to rebalance the supervision load.  Has been successfully losing weight for a while now.  Unfortunately, found blood in stool, and is having a colonoscopy in about 8 weeks.    F is calling frequently, trying to urge her in various ways, which is always initially uncomfortable, but has decided at least he cares, and is clear she is not in danger from him being a little intrusive.  F has colon cancer presently, and she figures that it -- and his caretaker urge kicking in -- are behind the calls, so it's welcome enough.  Also urging her, realistically, to get her own will done.  Probed her preferences about common will issues in session to warm up going through with it.  Following the sisters' trip, where she mentioned loaded commentary on family secrets, she took up bead art as a hobby.  Enjoying it so far.  Probed whether she would want to circle back to the family secrets conversation, not inclined to.  Continues to be OK carrying the secret of F's molestation to death without confrontation and no plausible need to warn siblings, just the hypothetical possibility she'll be cornered sometime about why the estrangement happened.  Re caregiving for Stephenie Einstein, wants to make sure it's OK with Tx for her to get a dog.  Does see Stephenie Einstein faring better, and staying honest.   Notes Stephenie Einstein does seem to have a good network of care, all told, certainly better than before, and that she is able to text her kids and her other friend Murlean Armour.  Enough confidence that Marylin is able to take a vacation out of the country and trust she does not need to be on call for suicidal crisis.  Affirmed growth in understanding and boundaries.  Therapeutic modalities: Cognitive Behavioral Therapy, Solution-Oriented/Positive Psychology, and Ego-Supportive  Mental Status/Observations:  Appearance:   Casual     Behavior:  Appropriate  Motor:  Normal  Speech/Language:   Clear and Coherent  Affect:  Appropriate  Mood:  normal  Thought process:  normal  Thought content:    WNL  Sensory/Perceptual disturbances:    WNL  Orientation:  Fully oriented  Attention:  Good    Concentration:  Good  Memory:  WNL  Insight:    Good  Judgment:   Good  Impulse Control:  Good   Risk Assessment: Danger to Self: No Self-injurious Behavior: No Danger to Others: No Physical Aggression / Violence: No Duty to Warn: No Access to Firearms a concern: No  Assessment of progress:  progressing well  Diagnosis:   ICD-10-CM   1. Generalized anxiety disorder  F41.1     2. Recurrent major depressive disorder, in partial remission (HCC)  F33.41     3. History of posttraumatic stress disorder (PTSD)  Z86.59     4. History of dysthymia  Z86.59     5. Relationship problem  with family member  Z63.8     6. Caregiver stress  Z63.6     7. Eating problem  Z72.4      Plan:  Work stress -- Continue to work to more reasonable workload and confer as actively as needed with new direct report about organizational structure and function Caregiving stress  Continuing bilateral consent to coordinate with Stephenie Einstein as a fellow patient and designated emergency call for [name] working with Stephenie Einstein and liaison with her kids at discretion Keep clear with Stephenie Einstein about Chiropodist as noted earlier for  frank and constructive dealings around chronic SI, lingering threat of suicide, and making more constructive life choices to address multiple stressful/depressogenic conditions Advise Al-Anon to [name] eating Diet plan and exercise as motivated.  Emphasize carb reduction. Option OA Response cost -- only buy ice cream in single servings, do not store Emotional awareness before deciding to binge -- identify and write feelings first Self-care Maintain exercise and nutritional programs Continue AA involvement and sponsorship as able Maintain home environment as morale booster Trauma and shame exposure Consider sharing "fanny face" story or other revelations to select siblings or friends Re father's overwrought approaches, either forgive his late-coming or overdriven guidance, or use assertive inquiry or changing the subject, at discretion Always self-affirm choice in how to deal with family exposures -- may share, confront, or carry it as she sees fit As interested, practice thinking of Larinda Plover but seeing him as either free of his old ways or small-minded and creating his own troubles enough not to seek him out Social integration -- extend social involvements beyond work, come up with a few "fun facts" on herself as ice breakers  Other recommendations/advice -- As may be noted above.  Continue to utilize previously learned skills ad lib. Medication compliance -- Maintain medication as prescribed and work faithfully with relevant prescriber(s) if any changes are desired or seem indicated. Crisis service -- Aware of call list and work-in appts.  Call the clinic on-call service, 988/hotline, 911, or present to [facility] or ER if any life-threatening psychiatric crisis. Followup -- Return for time as already scheduled.  Next scheduled visit with me 10/24/2023.  Next scheduled in this office 10/10/2023.  Maretta Shaper, PhD Delora Ferry, PhD LP Clinical Psychologist, [facility] [address], [address] [place], [place] [address] [phone]

## 2023-09-19 NOTE — Telephone Encounter (Signed)
 No further review needed

## 2023-09-25 ENCOUNTER — Encounter: Payer: Self-pay | Admitting: Bariatrics

## 2023-09-25 ENCOUNTER — Ambulatory Visit: Payer: PRIVATE HEALTH INSURANCE | Admitting: Bariatrics

## 2023-09-25 VITALS — BP 129/83 | HR 97 | Temp 97.7°F | Ht 66.0 in | Wt 171.0 lb

## 2023-09-25 DIAGNOSIS — E669 Obesity, unspecified: Secondary | ICD-10-CM | POA: Diagnosis not present

## 2023-09-25 DIAGNOSIS — Z6827 Body mass index (BMI) 27.0-27.9, adult: Secondary | ICD-10-CM

## 2023-09-25 DIAGNOSIS — E88819 Insulin resistance, unspecified: Secondary | ICD-10-CM

## 2023-09-25 NOTE — Progress Notes (Signed)
 WEIGHT SUMMARY AND BIOMETRICS  Weight Lost Since Last Visit: 4lb  Weight Gained Since Last Visit: 0   Vitals Temp: 97.7 F (36.5 C) BP: 129/83 Pulse Rate: 97 SpO2: 96 %   Anthropometric Measurements Height: 5\' 6"  (1.676 m) Weight: 171 lb (77.6 kg) BMI (Calculated): 27.61 Weight at Last Visit: 175lb Weight Lost Since Last Visit: 4lb Weight Gained Since Last Visit: 0 Starting Weight: 222lb Total Weight Loss (lbs): 51 lb (23.1 kg)   Body Composition  Body Fat %: 27.9 % Fat Mass (lbs): 47.8 lbs Muscle Mass (lbs): 117.6 lbs Total Body Water  (lbs): 73 lbs Visceral Fat Rating : 6   Other Clinical Data Fasting: no Labs: no Today's Visit #: 10 Starting Date: 02/28/23    OBESITY Jania is here to discuss her progress with her obesity treatment plan along with follow-up of her obesity related diagnoses.    Nutrition Plan: the Category 4 plan - 85% adherence.  Current exercise: walking  Interim History:  She is down 4 pounds since her last visit. Eating all of the food on the plan., Protein intake is as prescribed, Is not skipping meals, Not journaling consistently., and Water  intake is inadequate.  Hunger is moderately controlled.  Cravings are moderately controlled.  Assessment/Plan:   Insulin  Resistance Verlean has had elevated fasting insulin  readings. Goal is HgbA1c < 5.7, fasting insulin  at l0 or less, and preferably at 5.  She denies polyphagia. Medication(s): none Lab Results  Component Value Date   HGBA1C 5.6 02/28/2023   Lab Results  Component Value Date   INSULIN  4.1 08/08/2023   INSULIN  11.1 02/28/2023    Plan Will work on the agreed upon plan. Will minimize refined carbohydrates ( sweets and starches), and focus more on complex carbohydrates.  Increase the micronutrients found in leafy greens, which include magnesium, polyphenols,  and vitamin C which have been postulated to help with insulin  sensitivity. Minimize "fast food" and cook more meals at home.  Increase fiber to 25 to 30 grams daily.  Will get a good scale and weigh once weekly.  She will increase her water  up to at least 64 ounces and up to 80 ounces if she is active or exercising. We have a goal weight of around 153 pounds. She will resume classes at the gym.    Generalized Obesity: Current BMI BMI (Calculated): 27.61    Italie is currently in the action stage of change. As such, her goal is to continue with weight loss efforts.  She has agreed to the Category 4 plan.  Exercise goals: For substantial health benefits, adults should do at least 150 minutes (2 hours and 30 minutes) a week of moderate-intensity, or 75 minutes (1 hour and 15 minutes) a week of vigorous-intensity aerobic physical activity, or an equivalent combination of moderate- and vigorous-intensity aerobic activity. Aerobic activity should be performed in episodes of  at least 10 minutes, and preferably, it should be spread throughout the week.She is walking and would like to go back to the gym.   Behavioral modification strategies: increasing lean protein intake, no meal skipping, decrease eating out, meal planning , increase water  intake, better snacking choices, planning for success, increasing vegetables, increasing fiber rich foods, decrease snacking , avoiding temptations, keep healthy foods in the home, increase frequency of journaling, pack lunch for work, and mindful eating.  Katalyn has agreed to follow-up with our clinic in 4 weeks.    Objective:   VITALS: Per patient if applicable, see vitals. GENERAL: Alert and in no acute distress. CARDIOPULMONARY: No increased WOB. Speaking in clear sentences.  PSYCH: Pleasant and cooperative. Speech normal rate and rhythm. Affect is appropriate. Insight and judgement are appropriate. Attention is focused, linear, and appropriate.  NEURO:  Oriented as arrived to appointment on time with no prompting.   Attestation Statements:    This was prepared with the assistance of Engineer, civil (consulting).  Occasional wrong-word or sound-a-like substitutions may have occurred due to the inherent limitations of voice recognition   Kirk Peper, DO

## 2023-10-10 ENCOUNTER — Ambulatory Visit (INDEPENDENT_AMBULATORY_CARE_PROVIDER_SITE_OTHER): Payer: No Typology Code available for payment source | Admitting: Physician Assistant

## 2023-10-10 ENCOUNTER — Encounter: Payer: Self-pay | Admitting: Physician Assistant

## 2023-10-10 DIAGNOSIS — F411 Generalized anxiety disorder: Secondary | ICD-10-CM | POA: Diagnosis not present

## 2023-10-10 DIAGNOSIS — F3341 Major depressive disorder, recurrent, in partial remission: Secondary | ICD-10-CM

## 2023-10-10 DIAGNOSIS — F4310 Post-traumatic stress disorder, unspecified: Secondary | ICD-10-CM | POA: Diagnosis not present

## 2023-10-10 MED ORDER — VIIBRYD 40 MG PO TABS
40.0000 mg | ORAL_TABLET | Freq: Every day | ORAL | 1 refills | Status: DC
Start: 2023-10-10 — End: 2024-04-15

## 2023-10-10 MED ORDER — PRAZOSIN HCL 5 MG PO CAPS
5.0000 mg | ORAL_CAPSULE | Freq: Every day | ORAL | 1 refills | Status: DC
Start: 2023-10-10 — End: 2024-04-15

## 2023-10-10 MED ORDER — AUVELITY 45-105 MG PO TBCR
EXTENDED_RELEASE_TABLET | ORAL | 1 refills | Status: DC
Start: 2023-10-10 — End: 2024-04-15

## 2023-10-10 MED ORDER — PRAZOSIN HCL 1 MG PO CAPS: 1.0000 mg | ORAL_CAPSULE | Freq: Every day | ORAL | 1 refills | Status: DC

## 2023-10-10 NOTE — Progress Notes (Signed)
 [facility]  Patient ID: Amanda Dillon,  MRN: [id number]  PCP: Sun, [name], MD  Date of Evaluation: 10/10/2023 time spent:20 minutes  Chief Complaint:  Chief Complaint   Depression; Follow-up    HISTORY/CURRENT STATUS: HPI For routine med check.  Doing well.  Went to [place]  recently on vacation and had a good time. Patient is able to enjoy things.  Energy and motivation are good.  Work is going well but busy.   No extreme sadness, tearfulness, or feelings of hopelessness.  Sleeps well most of the time.  No nightmares.  ADLs and personal hygiene are normal.   Denies any changes in concentration, making decisions, or remembering things.  Appetite has not changed.  Weight is stable.   Is losing weight purposely, she's going to [facility] which is helpful.  No c/o anxiety.  Denies suicidal or homicidal thoughts.  Patient denies increased energy with decreased need for sleep, increased talkativeness, racing thoughts, impulsivity or risky behaviors, increased spending, increased libido, grandiosity, increased irritability or anger, paranoia, or hallucinations.  Denies dizziness, syncope, seizures, numbness, tingling, tremor, tics, unsteady gait, slurred speech, confusion. Denies muscle or joint pain, stiffness, or dystonia. Denies unexplained weight loss, frequent infections, or sores that heal slowly.  No polyphagia, polydipsia, or polyuria. Denies visual changes or paresthesias.   Individual Medical History/ Review of Systems: Changes? :No   Past medications for mental health diagnoses include: Uncertain.  She has taken a higher dose of Wellbutrin  XL which did help but she has not needed that strength for a while. Abilify  caused wt gain.  Allergies: Patient has no known allergies.  Current Medications:  Current Outpatient Medications:    Dextromethorphan-buPROPion  ER (AUVELITY ) 45-105 MG TBCR, TAKE ONE (1) TABLET BY MOUTH TWICE DAILY, Disp: 180 tablet, Rfl: 1    estradiol (ESTRACE) 2 MG tablet, Take 1 tablet by mouth daily. (Patient not taking: Reported on 10/10/2023), Disp: , Rfl:    prazosin  (MINIPRESS ) 1 MG capsule, Take 1 capsule (1 mg total) by mouth at bedtime., Disp: 90 capsule, Rfl: 1   prazosin  (MINIPRESS ) 5 MG capsule, Take 1 capsule (5 mg total) by mouth at bedtime., Disp: 90 capsule, Rfl: 1   progesterone (PROMETRIUM) 100 MG capsule, Take 100 mg by mouth at bedtime. (Patient not taking: Reported on 10/10/2023), Disp: , Rfl:    VIIBRYD  40 MG TABS, Take 1 tablet (40 mg total) by mouth daily., Disp: 90 tablet, Rfl: 1 Medication Side Effects: none  Family Medical/ Social History: Changes?  no  MENTAL HEALTH EXAM:  There were no vitals taken for this visit.There is no height or weight on file to calculate BMI.  General Appearance: Casual and Well Groomed  Eye Contact:  Good  Speech:  Clear and Coherent and Normal Rate  Volume:  Normal  Mood:  Euthymic  Affect:  Congruent  Thought Process:  Goal Directed and Descriptions of Associations: Circumstantial  Orientation:  Full (Time, Place, and Person)  Thought Content: Logical   Suicidal Thoughts:  No  Homicidal Thoughts:  No  Memory:  WNL  Judgement:  Good  Insight:  Good  Psychomotor Activity:  Normal  Concentration:  Concentration: Good and Attention Span: Good  Recall:  Good  Fund of Knowledge: Good  Language: Good  Assets:  Communication Skills Desire for Improvement Financial Resources/Insurance Housing Physical Health Resilience Transportation Vocational/Educational  ADL's:  Intact  Cognition: WNL  Prognosis:  Good   DIAGNOSES:    ICD-10-CM   1. Recurrent major  depressive disorder, in partial remission (HCC)  F33.41     2. Generalized anxiety disorder  F41.1     3. PTSD (post-traumatic stress disorder) - stable  F43.10       Receiving Psychotherapy: No With Dr. Delora Ferry in the past  RECOMMENDATIONS:  PDMP was reviewed.  No controlled substances. I provided  20 minutes of face to face time during this encounter, including time spent before and after the visit in records review, medical decision making, counseling pertinent to today's visit, and charting.   She's doing well so no changes need to be made.   Continue Auvelity  45-105 mg, 1 p.o. twice daily. Continue prazosin   6 mg p.o. nightly. Continue Viibryd  40 mg, 1 p.o. daily. (Must be brand. Generic is NOT effective.) Continue vitamin D  50,000 IUs, 1 p.o. weekly. Continue therapy with Dr. Delora Ferry.  Return in 6 months.  Marvia Slocumb, PA-C

## 2023-10-24 ENCOUNTER — Ambulatory Visit (INDEPENDENT_AMBULATORY_CARE_PROVIDER_SITE_OTHER): Payer: No Typology Code available for payment source | Admitting: Psychiatry

## 2023-10-24 DIAGNOSIS — F4310 Post-traumatic stress disorder, unspecified: Secondary | ICD-10-CM

## 2023-10-24 DIAGNOSIS — Z638 Other specified problems related to primary support group: Secondary | ICD-10-CM

## 2023-10-24 DIAGNOSIS — F411 Generalized anxiety disorder: Secondary | ICD-10-CM | POA: Diagnosis not present

## 2023-10-24 DIAGNOSIS — Z636 Dependent relative needing care at home: Secondary | ICD-10-CM

## 2023-10-24 DIAGNOSIS — Z8659 Personal history of other mental and behavioral disorders: Secondary | ICD-10-CM | POA: Diagnosis not present

## 2023-10-24 DIAGNOSIS — F3341 Major depressive disorder, recurrent, in partial remission: Secondary | ICD-10-CM

## 2023-10-24 NOTE — Progress Notes (Signed)
 Psychotherapy Progress Note [facility] Amanda Kendall, PhD LP  Patient ID: Amanda Dillon [facility])    MRN: [id number] Therapy format: Individual psychotherapy Date: 10/24/2023      Start: 5:03p     Stop: 5:53p     Time Spent: 50 min Location: In-person   Session narrative (presenting needs, interim history, self-report of stressors and symptoms, applications of prior therapy, status changes, and interventions made in session) Hard time with Amanda Dillon recently, when she took a depressive turn, staying in bed, and had altered her med schedule trying to force sleep.  Difficult to convince her to stop obsessing, especially about the kids.  Appropriately coaching her to be specific in making and asking plans, and in embracing aspects of the life she has made now.  Personally, returns to issue of a niece Amanda Dillon with anxiety and in residential EDO treatment.  Had opportunity to witness about her own treatment at [facility] 14 years ago.  It's re-energizing a set of issues for her, including how conspicuously silent parents have been about her alcohol recovery, and finding out [facility] counselors saw her parents at a time when she was told they'd handle Amanda Dillon being unable to meet with them, suggesting they would not meet or disclose.  Discussed reasons to believe or have faith that they kept her confidence, and what if anything she would do differently now if she could determine they did or did not.  Had colonoscopy May 9.  Clean results, but realized she had worked up into a pretty good lather that she might have cancer, enough so that it overshadowed her [place]  trip with thoughts like how she's fortunate to see this before she dies.  Normalized thinking that way under the circumstances and encouraged in applying healthy skepticism to intrusive thoughts.  Resolved to work on talking points for asking parents about her [employer] [place] question.  Therapeutic modalities: Cognitive Behavioral  Therapy, Solution-Oriented/Positive Psychology, and Ego-Supportive  Mental Status/Observations:  Appearance:   Casual     Behavior:  Appropriate  Motor:  Normal  Speech/Language:   Clear and Coherent  Affect:  Appropriate  Mood:  normal  Thought process:  normal  Thought content:    WNL  Sensory/Perceptual disturbances:    WNL  Orientation:  Fully oriented  Attention:  Good    Concentration:  Good  Memory:  WNL  Insight:    Good  Judgment:   Good  Impulse Control:  Good   Risk Assessment: Danger to Self: No Self-injurious Behavior: No Danger to Others: No Physical Aggression / Violence: No Duty to Warn: No Access to Firearms a concern: No  Assessment of progress:  progressing  Diagnosis:   ICD-10-CM   1. Recurrent major depressive disorder, in partial remission (HCC)  F33.41     2. Generalized anxiety disorder  F41.1     3. PTSD (post-traumatic stress disorder) - stable  F43.10     4. History of dysthymia  Z86.59     5. Relationship problem with family member  Z63.8     6. Caregiver stress  Z63.6      Plan:  Work stress -- Continue to work to more reasonable workload and confer as actively as needed with new direct report about organizational structure and function Caregiving stress  Continuing bilateral consent to coordinate with Amanda Dillon as a fellow patient and designated emergency call for Amanda Dillon working with Amanda Dillon and liaison with her kids at discretion Keep clear with Amanda Dillon about Water engineer  Tactics as noted earlier for frank and constructive dealings around chronic SI, lingering threat of suicide, and making more constructive life choices to address multiple stressful/depressogenic conditions Advise Al-Anon to Amanda Dillon Binge eating Diet plan and exercise as motivated.  Emphasize carb reduction. Option OA Response cost -- only buy ice cream in single servings, do not store Emotional awareness before deciding to binge -- identify and write feelings  first Self-care Maintain exercise and nutritional programs Continue AA involvement and sponsorship as able Maintain home environment as morale booster Trauma and shame exposure Consider sharing fanny face story or other revelations to select siblings or friends Re father's overwrought approaches, either forgive his late-coming or overdriven guidance, or use assertive inquiry or changing the subject, at discretion Always self-affirm choice in how to deal with family exposures -- may share, confront, or carry it as she sees fit As interested, practice thinking of Amanda Dillon but seeing him as either free of his old ways or small-minded and creating his own troubles enough not to seek him out Social integration -- extend social involvements beyond work, come up with a few fun facts on herself as ice breakers  Other recommendations/advice -- As may be noted above.  Continue to utilize previously learned skills ad lib. Medication compliance -- Maintain medication as prescribed and work faithfully with relevant prescriber(s) if any changes are desired or seem indicated. Crisis service -- Aware of call list and work-in appts.  Call the clinic on-call service, 988/hotline, 911, or present to [facility] or ER if any life-threatening psychiatric crisis. Followup -- No follow-ups on file.  Next scheduled visit with me 12/26/2023.  Next scheduled in this office 12/26/2023.  Amanda Kendall, PhD Amanda Kendall, PhD LP Clinical Psychologist, [facility] [address], [address] [place], [place] [address] [phone]

## 2023-11-01 ENCOUNTER — Ambulatory Visit: Payer: PRIVATE HEALTH INSURANCE | Admitting: Bariatrics

## 2023-11-01 VITALS — BP 130/80 | HR 57 | Temp 97.6°F | Ht 66.0 in | Wt 168.0 lb

## 2023-11-01 DIAGNOSIS — E669 Obesity, unspecified: Secondary | ICD-10-CM | POA: Diagnosis not present

## 2023-11-01 DIAGNOSIS — E88819 Insulin resistance, unspecified: Secondary | ICD-10-CM

## 2023-11-01 DIAGNOSIS — Z6827 Body mass index (BMI) 27.0-27.9, adult: Secondary | ICD-10-CM | POA: Diagnosis not present

## 2023-11-01 NOTE — Progress Notes (Signed)
 WEIGHT SUMMARY AND BIOMETRICS  Weight Lost Since Last Visit: 3lb  Weight Gained Since Last Visit: 0   Vitals Temp: 97.6 F (36.4 C) BP: 130/80 Pulse Rate: (!) 57 SpO2: 100 %   Anthropometric Measurements Height: 5' 6 (1.676 m) Weight: 168 lb (76.2 kg) BMI (Calculated): 27.13 Weight at Last Visit: 171lb Weight Lost Since Last Visit: 3lb Weight Gained Since Last Visit: 0 Starting Weight: 222lb Total Weight Loss (lbs): 54 lb (24.5 kg)   Body Composition  Body Fat %: 36.1 % Fat Mass (lbs): 60.8 lbs Muscle Mass (lbs): 102.4 lbs Total Body Water  (lbs): 74.6 lbs Visceral Fat Rating : 8   Other Clinical Data Fasting: no Labs: no Today's Visit #: 11 Starting Date: 02/28/23    OBESITY Maliyah is here to discuss her progress with her obesity treatment plan along with follow-up of her obesity related diagnoses.    Nutrition Plan: the Category 4 plan - 90% adherence.  Current exercise: walking  Interim History:  She is down 3 lbs since her last visit.  Eating all of the food on the plan., Protein intake is as prescribed, Is skipping meals, Water  intake is adequate., and Denies polyphagia  Hunger is moderately controlled.  Cravings are moderately controlled.  Assessment/Plan:    Insulin  Resistance Raguel has had elevated fasting insulin  readings. Goal is HgbA1c < 5.7, fasting insulin  at l0 or less, and preferably at 5.  She  denies polyphagia. Medication(s): none Lab Results  Component Value Date   HGBA1C 5.6 02/28/2023   Lab Results  Component Value Date   INSULIN  4.1 08/08/2023   INSULIN  11.1 02/28/2023    Plan Will work on the agreed upon plan. Will minimize refined carbohydrates ( sweets and starches), and focus more on complex carbohydrates.  Increase the micronutrients found in leafy greens, which include magnesium, polyphenols, and  vitamin C which have been postulated to help with insulin  sensitivity. Minimize fast food and cook more meals at home.  Increase fiber to 25 to 30 grams daily.  Discussed protein shakes in detail and handout given.  She will try protein shake whenever she is not getting enough protein for the day and/or if she misses a meal.     Generalized Obesity: Current BMI BMI (Calculated): 27.13   Chandi is currently in the action stage of change. As such, her goal is to continue with weight loss efforts.  She has agreed to the Category 4 plan.  Exercise goals: For substantial health benefits, adults should do at least 150 minutes (2 hours and 30 minutes) a week of moderate-intensity, or 75 minutes (1 hour and 15 minutes) a week of vigorous-intensity aerobic physical activity, or an equivalent combination of moderate- and vigorous-intensity aerobic activity. Aerobic activity should be performed in episodes of at least 10 minutes, and preferably, it should be spread throughout the week.  Behavioral modification  strategies: no meal skipping, avoiding temptations, keep healthy foods in the home, weigh protein portions, pack lunch for work, and mindful eating.  Analei has agreed to follow-up with our clinic in 4 weeks.    Objective:   VITALS: Per patient if applicable, see vitals. GENERAL: Alert and in no acute distress. CARDIOPULMONARY: No increased WOB. Speaking in clear sentences.  PSYCH: Pleasant and cooperative. Speech normal rate and rhythm. Affect is appropriate. Insight and judgement are appropriate. Attention is focused, linear, and appropriate.  NEURO: Oriented as arrived to appointment on time with no prompting.   Attestation Statements:   This was prepared with the assistance of Engineer, civil (consulting).  Occasional wrong-word or sound-a-like substitutions may have occurred due to the inherent limitations of voice recognition   Kirk Peper, DO

## 2023-11-29 ENCOUNTER — Ambulatory Visit: Payer: PRIVATE HEALTH INSURANCE | Admitting: Bariatrics

## 2023-11-29 ENCOUNTER — Encounter: Payer: Self-pay | Admitting: Bariatrics

## 2023-11-29 VITALS — BP 146/84 | HR 56 | Temp 97.5°F | Ht 66.0 in | Wt 161.0 lb

## 2023-11-29 DIAGNOSIS — Z6825 Body mass index (BMI) 25.0-25.9, adult: Secondary | ICD-10-CM | POA: Diagnosis not present

## 2023-11-29 DIAGNOSIS — E669 Obesity, unspecified: Secondary | ICD-10-CM | POA: Diagnosis not present

## 2023-11-29 DIAGNOSIS — R030 Elevated blood-pressure reading, without diagnosis of hypertension: Secondary | ICD-10-CM | POA: Diagnosis not present

## 2023-11-29 NOTE — Progress Notes (Signed)
                                                                                                              WEIGHT SUMMARY AND BIOMETRICS  Weight Lost Since Last Visit: 7lb  Weight Gained Since Last Visit: 0   Vitals Temp: (!) 97.5 F (36.4 C) BP: (!) 146/84 Pulse Rate: (!) 56 SpO2: 96 %   Anthropometric Measurements Height: 5' 6 (1.676 m) Weight: 161 lb (73 kg) BMI (Calculated): 26 Weight at Last Visit: 168lb Weight Lost Since Last Visit: 7lb Weight Gained Since Last Visit: 0 Starting Weight: 222lb Total Weight Loss (lbs): 61 lb (27.7 kg)   Body Composition  Body Fat %: 35.7 % Fat Mass (lbs): 57.4 lbs Muscle Mass (lbs): 98.4 lbs Total Body Water  (lbs): 73 lbs Visceral Fat Rating : 8   Other Clinical Data Fasting: no Labs: no Today's Visit #: 12 Starting Date: 02/28/23    OBESITY Amanda Dillon is here to discuss her progress with her obesity treatment plan along with follow-up of her obesity related diagnoses.    Nutrition Plan: the Category 4 plan - 90% adherence.  Current exercise: walking  Interim History:  She is down another 7 lbs since her last visit.  Eating all of the food on the plan., Protein intake is as prescribed, Is not skipping meals, and Water  intake is adequate.  Hunger is moderately controlled.  Cravings are moderately controlled.  Assessment/Plan:   Elevated blood pressure without a diagnosis of HTN:   She has stopped the Minipress  that was for another indication.  She is anxious today secondary to the thunderstorm.  Plan: She will check her blood pressure periodically at home.    Generalized Obesity: Current BMI BMI (Calculated): 26   Amanda Dillon is currently in the action stage of change. As such, her goal is to continue with weight loss efforts.  She has agreed to the Category 4 plan.  Exercise goals: Adults should also include muscle-strengthening activities that involve all major muscle groups on 2 or more days a  week.  Behavioral modification strategies: increasing lean protein intake, no meal skipping, decrease eating out, meal planning , increase water  intake, better snacking choices, planning for success, increasing vegetables, decrease snacking , avoiding temptations, keep healthy foods in the home, weigh protein portions, measure portion sizes, and work on smaller portions.  Amanda Dillon has agreed to follow-up with our clinic in 4 weeks.   Objective:   VITALS: Per patient if applicable, see vitals. GENERAL: Alert and in no acute distress. CARDIOPULMONARY: No increased WOB. Speaking in clear sentences.  PSYCH: Pleasant and cooperative. Speech normal rate and rhythm. Affect is appropriate. Insight and judgement are appropriate. Attention is focused, linear, and appropriate.  NEURO: Oriented as arrived to appointment on time with no prompting.   Attestation Statements:   This was prepared with the assistance of Engineer, civil (consulting).  Occasional wrong-word or sound-a-like substitutions may have occurred due to the inherent limitations of voice recognition   Clayborne Daring, DO

## 2023-12-21 ENCOUNTER — Telehealth: Payer: Self-pay

## 2023-12-21 NOTE — Telephone Encounter (Signed)
 PA renewal submitted for Viibryd  40 mg Brand #90/90 day with [facility]

## 2023-12-21 NOTE — Telephone Encounter (Signed)
 Noted

## 2023-12-21 NOTE — Telephone Encounter (Signed)
 PA approved for BRAND Viibryd  40 mg tablets #90/90 with [facility] effective 12/21/23-12/20/24, EJ#[id number]

## 2023-12-26 ENCOUNTER — Ambulatory Visit: Payer: No Typology Code available for payment source | Admitting: Psychiatry

## 2023-12-26 DIAGNOSIS — F411 Generalized anxiety disorder: Secondary | ICD-10-CM | POA: Diagnosis not present

## 2023-12-26 DIAGNOSIS — F4310 Post-traumatic stress disorder, unspecified: Secondary | ICD-10-CM | POA: Diagnosis not present

## 2023-12-26 DIAGNOSIS — Z638 Other specified problems related to primary support group: Secondary | ICD-10-CM

## 2023-12-26 DIAGNOSIS — F3341 Major depressive disorder, recurrent, in partial remission: Secondary | ICD-10-CM | POA: Diagnosis not present

## 2023-12-26 DIAGNOSIS — Z8659 Personal history of other mental and behavioral disorders: Secondary | ICD-10-CM | POA: Diagnosis not present

## 2023-12-26 DIAGNOSIS — Z634 Disappearance and death of family member: Secondary | ICD-10-CM

## 2023-12-26 DIAGNOSIS — Z566 Other physical and mental strain related to work: Secondary | ICD-10-CM

## 2023-12-26 DIAGNOSIS — Z636 Dependent relative needing care at home: Secondary | ICD-10-CM

## 2023-12-26 NOTE — Progress Notes (Signed)
 Psychotherapy Progress Note [facility] Amanda Kendall, PhD LP  Patient ID: Amanda Dillon [facility])    MRN: [id number] Therapy format: Individual psychotherapy Date: 12/26/2023      Start: 4:08p     Stop: 4:58p     Time Spent: 50 min Location: In-person   Session narrative (presenting needs, interim history, self-report of stressors and symptoms, applications of prior therapy, status changes, and interventions made in session) Anxiety up at work, and a concern for [employer].  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  Depression not an issue, but anxiety, certainly.  Arm numbness under stress, and environmental shifts setting off more migraines.  Can;t afford to be doped on triptans, uses Excedrin Migraine.  Suggested look into diclofenac strategy. Some stress from urging to speak up more (despite being an introvert).  Has been able to bring up boss's critical habit, and hoping to address the inherent difficulties of video chat and copious email habits she has established.  Has found that I feel as an assertiveness tactic is not taken well; recommend use I think, I see, and It looks.  Hours are stretched -- reporting to work 5:30am, leave 4:30pm, typically working through lunch time, and on call sometimes.  Clear about not checking email at home.  Encouraged   Physically, clearly has succeeded losing weight -- 60 # down, wants 20 more.  Drinking water  regularly instead of soda.  [name] -- Bronchitis is impinging on her ability to handle her daughter's wedding this weekend.  Worried she'll opt out of the wedding altogether to prevent feeling anxiety and mixed feelings.  Says she has even voiced the idea of using the first dance as an opportunity to confide in D's new FIL what problems she's having.  Clear agreement she should not do that, and endorsed advising her against that, as any sort of revelations about their troubles, however cathartic, would  complicate the festivities for others and very likely lead to her D feeling betrayed outright.  Also took up social anxiety mixing with wealthy people and witnessing the amount of drinking that may go on.  Agreed both Inocente and [name] may take breathers as needed and make their exit from the reception before a suspected after party starts rolling that would be provocative to both of them, but definitely stay through the traditional festivities.    Got a new dog, Bec -- named as an acronym for the three dogs she's parted with in recent memory.  Shares picture, clearly happy to have her.  Therapeutic modalities: Cognitive Behavioral Therapy, Solution-Oriented/Positive Psychology, Environmental manager, and strategic  Mental Status/Observations:  Appearance:   Casual     Behavior:  Appropriate  Motor:  Normal  Speech/Language:   Clear and Coherent  Affect:  Appropriate  Mood:  normal  Thought process:  normal  Thought content:    WNL  Sensory/Perceptual disturbances:    WNL  Orientation:  Fully oriented  Attention:  Good    Concentration:  Good  Memory:  WNL  Insight:    Good  Judgment:   Good  Impulse Control:  Good   Risk Assessment: Danger to Self: No Self-injurious Behavior: No Danger to Others: No Physical Aggression / Violence: No Duty to Warn: No Access to Firearms a concern: No  Assessment of progress:  progressing well  Diagnosis:   ICD-10-CM   1. Recurrent major depressive disorder, in partial remission (HCC)  F33.41     2. Generalized anxiety disorder  F41.1     3. PTSD (post-traumatic stress disorder) - stable  F43.10     4. History of dysthymia  Z86.59     5. Relationship problem with family member  Z63.8     6. Caregiver stress  Z63.6     7. Work stress  Z56.6     8. Bereavement (3 dogs)  Z63.4      Plan:  Work stress -- Continue to work to more reasonable workload and confer as actively as needed with new direct report about organizational structure and  function Caregiving stress  Continuing bilateral consent to coordinate with [name] as a fellow patient and designated emergency call for [name] working with [name] and liaison with her kids at discretion Keep clear with [name] about Chiropodist as noted earlier for frank and constructive dealings around chronic SI, lingering threat of suicide, and making more constructive life choices to address multiple stressful/depressogenic conditions Advise Al-Anon to [name] eating Diet plan and exercise as motivated.  Emphasize carb reduction. Option OA Response cost -- only buy ice cream in single servings, do not store Emotional awareness before deciding to binge -- identify and write feelings first Self-care Maintain exercise and nutritional programs Continue AA involvement and sponsorship as able Maintain home environment as morale booster Trauma and shame exposure Consider sharing fanny face story or other revelations to select siblings or friends Re father's overwrought approaches, either forgive his late-coming or overdriven guidance, or use assertive inquiry or changing the subject, at discretion Always self-affirm choice in how to deal with family exposures -- may share, confront, or carry it as she sees fit As interested, practice thinking of [place] but seeing him as either free of his old ways or small-minded and creating his own troubles enough not to seek him out Social integration -- extend social involvements beyond work, come up with a few fun facts on herself as ice breakers  Other recommendations/advice -- As may be noted above.  Continue to utilize previously learned skills ad lib. Medication compliance -- Maintain medication as prescribed and work faithfully with relevant prescriber(s) if any changes are desired or seem indicated. Crisis service -- Aware of call list and work-in appts.  Call the clinic on-call service, 988/hotline, 911, or present to [facility]  or ER if any life-threatening psychiatric crisis. Followup -- Return for time as already scheduled, avail earlier @ PT's need.  Next scheduled visit with me 02/27/2024.  Next scheduled in this office 02/27/2024.  Amanda Kendall, PhD Amanda Kendall, PhD LP Clinical Psychologist, [facility] [address], [address] [place], [place] [address] [phone]

## 2024-01-01 ENCOUNTER — Ambulatory Visit: Payer: PRIVATE HEALTH INSURANCE | Admitting: Bariatrics

## 2024-01-15 ENCOUNTER — Ambulatory Visit: Payer: PRIVATE HEALTH INSURANCE | Admitting: Bariatrics

## 2024-02-19 ENCOUNTER — Ambulatory Visit: Payer: PRIVATE HEALTH INSURANCE | Admitting: Bariatrics

## 2024-02-25 ENCOUNTER — Ambulatory Visit: Payer: PRIVATE HEALTH INSURANCE | Admitting: Bariatrics

## 2024-02-25 ENCOUNTER — Encounter: Payer: Self-pay | Admitting: Bariatrics

## 2024-02-25 VITALS — BP 147/83 | HR 63 | Temp 97.9°F | Ht 66.0 in | Wt 157.0 lb

## 2024-02-25 DIAGNOSIS — Z6825 Body mass index (BMI) 25.0-25.9, adult: Secondary | ICD-10-CM

## 2024-02-25 DIAGNOSIS — E88819 Insulin resistance, unspecified: Secondary | ICD-10-CM | POA: Diagnosis not present

## 2024-02-25 DIAGNOSIS — E669 Obesity, unspecified: Secondary | ICD-10-CM

## 2024-02-25 NOTE — Progress Notes (Unsigned)
 WEIGHT SUMMARY AND BIOMETRICS  Weight Lost Since Last Visit: 4lb  Weight Gained Since Last Visit: 0   Vitals Temp: 97.9 F (36.6 C) BP: (!) 147/83 Pulse Rate: 63 SpO2: 97 %   Anthropometric Measurements Height: 5' 6 (1.676 m) Weight: 157 lb (71.2 kg) BMI (Calculated): 25.35 Weight at Last Visit: 161lb Weight Lost Since Last Visit: 4lb Weight Gained Since Last Visit: 0 Starting Weight: 222lb Total Weight Loss (lbs): 65 lb (29.5 kg)   Body Composition  Body Fat %: 14.8 % Fat Mass (lbs): 23.4 lbs Muscle Mass (lbs): 127.4 lbs Total Body Water  (lbs): 74.2 lbs Visceral Fat Rating : 4   Other Clinical Data Fasting: no Labs: no Today's Visit #: 13 Starting Date: 02/28/23    OBESITY Amanda Dillon is here to discuss her progress with her obesity treatment plan along with follow-up of her obesity related diagnoses.    Nutrition Plan: the Category 4 plan - 85% adherence.  Current exercise: walking  Interim History:  She is down 4 lbs since her last visit.  {aabnutritionassessment:29213}   Pharmacotherapy: Amanda Dillon is on {dwwpharmacotherapy:29109} Adverse side effects: {dwwse:29122} Hunger is {EWCONTROLASSESSMENT:24261}.  Cravings are {EWCONTROLASSESSMENT:24261}.  Assessment/Plan:    Insulin  Resistance Amanda Dillon has had elevated fasting insulin  readings. Goal is HgbA1c < 5.7, fasting insulin  at l0 or less, and preferably at 5.  She reports *** denies polyphagia. Medication(s): *** Lab Results  Component Value Date   HGBA1C 5.6 02/28/2023   Lab Results  Component Value Date   INSULIN  4.1 08/08/2023   INSULIN  11.1 02/28/2023    Plan Medication(s): {dwwpharmacotherapy:29109} Will work on the agreed upon plan. Will minimize refined carbohydrates ( sweets and starches), and focus more on complex carbohydrates.  Increase the micronutrients found in leafy  greens, which include magnesium, polyphenols, and vitamin C which have been postulated to help with insulin  sensitivity. Minimize fast food and cook more meals at home.  Increase fiber to 25 to 30 grams daily.  Information sheet on  Insulin  Resistance and Prediabetes.     Generalized Obesity: Current BMI BMI (Calculated): 25.35   Pharmacotherapy Plan {dwwmed:29123}  {dwwpharmacotherapy:29109}  Amanda Dillon {CHL AMB IS/IS NOT:210130109} currently in the action stage of change. As such, her goal is to {MWMwtloss#1:210800005}.  She has agreed to {dwwsldiets:29085}.  Exercise goals: {MWM EXERCISE RECS:23473}  Behavioral modification strategies: {dwwslwtlossstrategies:29088}.  Amanda Dillon has agreed to follow-up with our clinic in {NUMBER 1-10:22536} weeks.    Objective:   VITALS: Per patient if applicable, see vitals. GENERAL: Alert and in no acute distress. CARDIOPULMONARY: No increased WOB. Speaking in clear sentences.  PSYCH: Pleasant and cooperative. Speech normal rate and rhythm. Affect is appropriate. Insight and judgement are appropriate. Attention is focused, linear, and appropriate.  NEURO: Oriented as arrived to appointment on time with no prompting.   Attestation Statements:   This was prepared with the assistance of [employer]  Dictation.  Occasional wrong-word or sound-a-like substitutions may have occurred due to the inherent limitations of voice recognition   Clayborne Daring, DO

## 2024-02-27 ENCOUNTER — Ambulatory Visit (INDEPENDENT_AMBULATORY_CARE_PROVIDER_SITE_OTHER): Admitting: Psychiatry

## 2024-02-27 DIAGNOSIS — F411 Generalized anxiety disorder: Secondary | ICD-10-CM

## 2024-02-27 DIAGNOSIS — Z8659 Personal history of other mental and behavioral disorders: Secondary | ICD-10-CM | POA: Diagnosis not present

## 2024-02-27 DIAGNOSIS — F3341 Major depressive disorder, recurrent, in partial remission: Secondary | ICD-10-CM

## 2024-02-27 DIAGNOSIS — Z566 Other physical and mental strain related to work: Secondary | ICD-10-CM | POA: Diagnosis not present

## 2024-02-27 NOTE — Progress Notes (Signed)
 Psychotherapy Progress Note [facility] [name], PhD LP  Patient ID: Amanda Dillon [facility])    MRN: [id number] Therapy format: Individual psychotherapy Date: 02/27/2024      Start: 5:09p     Stop: 5:52p     Time Spent: 43 min Location: In-person   Session narrative (presenting needs, interim history, self-report of stressors and symptoms, applications of prior therapy, status changes, and interventions made in session) At work, new supervisor has continued to be brusque and negative, heavy on email instead of talking, and vaguely threatening, e.g., telling her she was doing formal coaching (an HR term) by snapping at her and then emailing.  Double messages how she doesn't think Amanda Dillon will make it here and don't take things so literally.  The working environment feels degraded as well by such an over reliance on electronic communication, even while people are present in the same hall or suite.  Tomorrow has a 1:1 with supervisor, discussed at length communication tactics and interpretation of her supervisor's messages and behavior, including recommendation to consult the Myers-Briggs personality typing supervisor asked of them all and represented herself with, any tips in MBTI websites for how hers can relate, and how she might represent herself tactfully in asking a more conducive approach.  Endorsed the reasonableness of asking how she's doing as a way of asking power to update its perceptions, reserving skepticism as needed for whether supervisor has an unacknowledged personality disorder and whether her own conscience is a better guide for work behavior choices.  Validated that it does tempt finding other work after over 2 decades in the same place, and it does seem to be a case of the new outsider in charge flexing by making it psychologically tougher on preexisting workers, but it may be worth figuring that whatever lose-lose communication she does only betrays her own  insecurity, and knowing that might help calm her own anxiety dealing with her.  No indications of probationary employment status, and certainly still a need for her on the job.  Briefly, Amanda Dillon is doing better of late, fears calmed about her becoming gravely ill, suicidally depressed, or badly self-sabotaging.  Continues to support her, along with a couple of other friends.   On appearance, she seems to have mastered her overeating issue, continues in healthy patterns of eating and activity and generally feeling better for it.  Therapeutic modalities: Cognitive Behavioral Therapy, Solution-Oriented/Positive Psychology, Ego-Supportive, and Assertiveness/Communication  Mental Status/Observations:  Appearance:   Casual     Behavior:  Appropriate  Motor:  Normal  Speech/Language:   Clear and Coherent  Affect:  Appropriate  Mood:  normal and some situational anxiety  Thought process:  normal  Thought content:    WNL  Sensory/Perceptual disturbances:    WNL  Orientation:  Fully oriented  Attention:  Good    Concentration:  Good  Memory:  WNL  Insight:    Good  Judgment:   Good  Impulse Control:  Good   Risk Assessment: Danger to Self: No Self-injurious Behavior: No Danger to Others: No Physical Aggression / Violence: No Duty to Warn: No Access to Firearms a concern: No  Assessment of progress:  progressing  Diagnosis:   ICD-10-CM   1. Recurrent major depressive disorder, in partial remission  F33.41     2. Generalized anxiety disorder  F41.1     3. Work stress  Z56.6     4. History of dysthymia, PTSD  Z86.59      Plan:  Work stress  Continue to to adjust relative workload where able and appropriate Confer as actively as needed with new direct report about organizational structure and function Apply communication/assertiveness tips to reduce stress and recruit clarity in supervisor relationship Caregiving stress  Continuing bilateral consent to coordinate with Amanda Dillon as a  fellow patient and designated emergency call for [name] working with Amanda Dillon and liaison with her kids at discretion Keep clear with Amanda Dillon about Chiropodist as noted earlier for frank and constructive dealings around chronic SI, lingering threat of suicide, and making more constructive life choices to address multiple stressful/depressogenic conditions Advise Al-Anon to Amanda Dillon Binge eating Diet plan and exercise as motivated.  Emphasize carb reduction. Option OA Response cost -- only buy ice cream in single servings, do not store Emotional awareness before deciding to binge -- identify and write feelings first Self-care Maintain exercise and nutritional programs Continue AA involvement and sponsorship as able Maintain home environment as morale booster Trauma and shame exposure Consider sharing fanny face story or other revelations to select siblings or friends Re father's overwrought approaches, either forgive his late-coming or overdriven guidance, or use assertive inquiry or changing the subject, at discretion Always self-affirm choice in how to deal with family exposures -- may share, confront, or carry it as she sees fit As interested, practice thinking of [place] but seeing him as either free of his old ways or small-minded and creating his own troubles enough not to seek him out Social integration -- extend social involvements beyond work, come up with a few fun facts on herself as ice breakers  Other recommendations/advice -- As may be noted above.  Continue to utilize previously learned skills ad lib. Medication compliance -- Maintain medication as prescribed and work faithfully with relevant prescriber(s) if any changes are desired or seem indicated. Crisis service -- Aware of call list and work-in appts.  Call the clinic on-call service, 988/hotline, 911, or present to [facility] or ER if any life-threatening psychiatric crisis. Followup -- No follow-ups on file.  Next  scheduled visit with me 04/30/2024.  Next scheduled in this office 04/15/2024.  Lamar Kendall, PhD [name], PhD LP Clinical Psychologist, [facility] [address], [address] [place], [place] [address] [phone]

## 2024-03-26 ENCOUNTER — Ambulatory Visit: Payer: PRIVATE HEALTH INSURANCE | Admitting: Bariatrics

## 2024-03-26 ENCOUNTER — Encounter: Payer: Self-pay | Admitting: Bariatrics

## 2024-03-26 VITALS — BP 156/94 | HR 67 | Ht 66.0 in | Wt 164.0 lb

## 2024-03-26 DIAGNOSIS — Z6826 Body mass index (BMI) 26.0-26.9, adult: Secondary | ICD-10-CM

## 2024-03-26 DIAGNOSIS — E669 Obesity, unspecified: Secondary | ICD-10-CM | POA: Diagnosis not present

## 2024-03-26 DIAGNOSIS — E88819 Insulin resistance, unspecified: Secondary | ICD-10-CM

## 2024-03-26 NOTE — Progress Notes (Signed)
 WEIGHT SUMMARY AND BIOMETRICS  Weight Lost Since Last Visit: 0  Weight Gained Since Last Visit: 7lb   Vitals BP: (!) 156/94 Pulse Rate: 67 SpO2: 95 %   Anthropometric Measurements Height: 5' 6 (1.676 m) Weight: 164 lb (74.4 kg) BMI (Calculated): 26.48 Weight at Last Visit: 157lb Weight Lost Since Last Visit: 0 Weight Gained Since Last Visit: 7lb Starting Weight: 222lb Total Weight Loss (lbs): 58 lb (26.3 kg)   Body Composition  Body Fat %: 36.2 % Fat Mass (lbs): 59.6 lbs Muscle Mass (lbs): 99.6 lbs Total Body Water  (lbs): 73.8 lbs Visceral Fat Rating : 8   Other Clinical Data Fasting: no Labs: no Today's Visit #: 14 Starting Date: 02/28/23    OBESITY Amanda Dillon is here to discuss her progress with her obesity treatment plan along with follow-up of her obesity related diagnoses.    Nutrition Plan: the Category 4 plan - 50% adherence.  Current exercise: none  Interim History:  She went up about 7 lbs. She will avoid the vending machine at work. She has been eating more sugar.  Eating all of the food on the plan., Protein intake is as prescribed, Is not skipping meals, and Water  intake is inadequate.   Pharmacotherapy: Tamiki is not on any anti-obesity medications.  Hunger is moderately controlled.  Cravings are poorly controlled.  Assessment/Plan:   Insulin  Resistance Banessa has had elevated fasting insulin  readings. Goal is HgbA1c < 5.7, fasting insulin  at l0 or less, and preferably at 5.  She reports some polyphagia. Medication(s): none Lab Results  Component Value Date   HGBA1C 5.6 02/28/2023   Lab Results  Component Value Date   INSULIN  4.1 08/08/2023   INSULIN  11.1 02/28/2023    Plan Medication(s): none Will work on the agreed upon plan. Will minimize refined carbohydrates ( sweets and starches), and focus more on complex  carbohydrates.  Increase the micronutrients found in leafy greens, which include magnesium, polyphenols, and vitamin C which have been postulated to help with insulin  sensitivity. Minimize fast food and cook more meals at home.  Increase fiber to 25 to 30 grams daily.  Will resume journaling on a regular basis.  Discussed strategies for Thanksgiving.    Generalized Obesity: Current BMI BMI (Calculated): 26.48   Pharmacotherapy Plan No anti-obesity medications at this time.   Shaquira is currently in the action stage of change. As such, her goal is to continue with weight loss efforts.  She has agreed to the Category 4 plan.  Exercise goals: All adults should avoid inactivity. Some physical activity is better than none, and adults who participate in any amount of physical activity gain some health benefits. She will increase her resistance training.    Behavioral modification strategies: increasing lean protein intake, meal planning , increase water  intake, better snacking choices, planning for success, keep healthy foods in the home, weigh protein  portions, measure portion sizes, and mindful eating.  Gerline has agreed to follow-up with our clinic in 4 weeks.      Objective:   VITALS: Per patient if applicable, see vitals. GENERAL: Alert and in no acute distress. CARDIOPULMONARY: No increased WOB. Speaking in clear sentences.  PSYCH: Pleasant and cooperative. Speech normal rate and rhythm. Affect is appropriate. Insight and judgement are appropriate. Attention is focused, linear, and appropriate.  NEURO: Oriented as arrived to appointment on time with no prompting.   Attestation Statements:   This was prepared with the assistance of Engineer, Civil (consulting).  Occasional wrong-word or sound-a-like substitutions may have occurred due to the inherent limitations of voice recognition   Clayborne Daring, DO

## 2024-04-15 ENCOUNTER — Encounter: Payer: Self-pay | Admitting: Physician Assistant

## 2024-04-15 ENCOUNTER — Ambulatory Visit (INDEPENDENT_AMBULATORY_CARE_PROVIDER_SITE_OTHER): Admitting: Physician Assistant

## 2024-04-15 DIAGNOSIS — F3341 Major depressive disorder, recurrent, in partial remission: Secondary | ICD-10-CM | POA: Diagnosis not present

## 2024-04-15 DIAGNOSIS — F411 Generalized anxiety disorder: Secondary | ICD-10-CM | POA: Diagnosis not present

## 2024-04-15 MED ORDER — PRAZOSIN HCL 5 MG PO CAPS: 5.0000 mg | ORAL_CAPSULE | Freq: Every day | ORAL | 1 refills | Status: AC

## 2024-04-15 MED ORDER — PRAZOSIN HCL 1 MG PO CAPS: 1.0000 mg | ORAL_CAPSULE | Freq: Every day | ORAL | 1 refills | Status: AC

## 2024-04-15 MED ORDER — VIIBRYD 40 MG PO TABS: 40.0000 mg | ORAL_TABLET | Freq: Every day | ORAL | 1 refills | Status: AC

## 2024-04-15 MED ORDER — AUVELITY 45-105 MG PO TBCR: EXTENDED_RELEASE_TABLET | ORAL | 1 refills | Status: AC

## 2024-04-15 NOTE — Progress Notes (Signed)
 [facility]  Patient ID: Amanda Dillon,  MRN: [id number]  PCP: Sun, [name], MD  Date of Evaluation: 04/15/2024 Time spent:20 minutes  Chief Complaint:  Chief Complaint   Anxiety; Depression; Follow-up    HISTORY/CURRENT STATUS: HPI For routine med check.  Amanda Dillon is doing well.  Patient is able to enjoy things.  Energy and motivation are good.  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   Anxious from that sometimes but it's just work stress.  No extreme sadness, tearfulness, or feelings of hopelessness.  Sleeps well and not having nightmares. ADLs and personal hygiene are normal.   Denies any changes in concentration, making decisions, or remembering things.  Appetite has not changed.  Weight is stable.  No mania, delirium, AH/VH.  No SI/HI.  Individual Medical History/ Review of Systems: Changes? :No   Past medications for mental health diagnoses include: Uncertain.  She has taken a higher dose of Wellbutrin  XL which did help but she has not needed that strength for a while. Abilify  caused wt gain.  Allergies: Patient has no known allergies.  Current Medications:  Current Outpatient Medications:    Dextromethorphan-buPROPion  ER (AUVELITY ) 45-105 MG TBCR, TAKE ONE (1) TABLET BY MOUTH TWICE DAILY, Disp: 180 tablet, Rfl: 1   estradiol (ESTRACE) 2 MG tablet, Take 1 tablet by mouth daily. (Patient not taking: Reported on 04/15/2024), Disp: , Rfl:    prazosin  (MINIPRESS ) 1 MG capsule, Take 1 capsule (1 mg total) by mouth at bedtime., Disp: 90 capsule, Rfl: 1   prazosin  (MINIPRESS ) 5 MG capsule, Take 1 capsule (5 mg total) by mouth at bedtime., Disp: 90 capsule, Rfl: 1   progesterone (PROMETRIUM) 100 MG capsule, Take 100 mg by mouth at bedtime. (Patient not taking: Reported on 04/15/2024), Disp: , Rfl:    VIIBRYD  40 MG TABS, Take 1 tablet (40 mg total) by mouth daily., Disp: 90 tablet, Rfl: 1 Medication Side Effects: none  Family Medical/ Social  History: Changes?  no  MENTAL HEALTH EXAM:  There were no vitals taken for this visit.There is no height or weight on file to calculate BMI.  General Appearance: Casual and Well Groomed  Eye Contact:  Good  Speech:  Clear and Coherent and Normal Rate  Volume:  Normal  Mood:  Euthymic  Affect:  Congruent  Thought Process:  Goal Directed and Descriptions of Associations: Circumstantial  Orientation:  Full (Time, Place, and Person)  Thought Content: Logical   Suicidal Thoughts:  No  Homicidal Thoughts:  No  Memory:  WNL  Judgement:  Good  Insight:  Good  Psychomotor Activity:  Normal  Concentration:  Concentration: Good and Attention Span: Good  Recall:  Good  Fund of Knowledge: Good  Language: Good  Assets:  Communication Skills Desire for Improvement Financial Resources/Insurance Housing Physical Health Resilience Social Support Transportation Vocational/Educational  ADL's:  Intact  Cognition: WNL  Prognosis:  Good   DIAGNOSES:    ICD-10-CM   1. Recurrent major depressive disorder, in partial remission  F33.41     2. Generalized anxiety disorder  F41.1       Receiving Psychotherapy: No With Dr. Jodie Dillon in the past  RECOMMENDATIONS:  PDMP was reviewed.  No controlled substances. I provided approximately  20 minutes of face to face time during this encounter, including time spent before and after the visit in records review, medical decision making, counseling pertinent to today's visit, and charting.   She's doing well so no changes need to  be made.   Continue Auvelity  45-105 mg, 1 p.o. twice daily. Continue prazosin   6 mg p.o. nightly. Continue Viibryd  40 mg, 1 p.o. daily. (Must be brand. Generic is NOT effective.) Continue therapy with Dr. Jodie Dillon.  Return in 6 months.  Verneita Cooks, PA-C

## 2024-04-29 ENCOUNTER — Ambulatory Visit: Payer: PRIVATE HEALTH INSURANCE | Admitting: Bariatrics

## 2024-04-30 ENCOUNTER — Ambulatory Visit: Admitting: Psychiatry

## 2024-04-30 DIAGNOSIS — F411 Generalized anxiety disorder: Secondary | ICD-10-CM

## 2024-04-30 DIAGNOSIS — F3342 Major depressive disorder, recurrent, in full remission: Secondary | ICD-10-CM

## 2024-04-30 DIAGNOSIS — Z8659 Personal history of other mental and behavioral disorders: Secondary | ICD-10-CM | POA: Diagnosis not present
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
## 2024-05-26 ENCOUNTER — Ambulatory Visit: Payer: PRIVATE HEALTH INSURANCE | Admitting: Bariatrics

## 2024-05-26 ENCOUNTER — Encounter: Payer: Self-pay | Admitting: Bariatrics

## 2024-05-26 VITALS — BP 149/99 | HR 66 | Ht 66.0 in | Wt 175.0 lb

## 2024-05-26 DIAGNOSIS — R030 Elevated blood-pressure reading, without diagnosis of hypertension: Secondary | ICD-10-CM | POA: Diagnosis not present

## 2024-05-26 DIAGNOSIS — E669 Obesity, unspecified: Secondary | ICD-10-CM | POA: Diagnosis not present

## 2024-05-26 DIAGNOSIS — R632 Polyphagia: Secondary | ICD-10-CM | POA: Diagnosis not present

## 2024-05-26 DIAGNOSIS — Z6828 Body mass index (BMI) 28.0-28.9, adult: Secondary | ICD-10-CM

## 2024-05-26 NOTE — Progress Notes (Unsigned)
" ° ° °                                                                                                     °    ° °  WEIGHT SUMMARY AND BIOMETRICS  Weight Lost Since Last Visit: 0  Weight Gained Since Last Visit: 11lb   Vitals BP: (!) 149/99 Pulse Rate: 66 SpO2: 95 %   Anthropometric Measurements Height: 5' 6 (1.676 m) Weight: 175 lb (79.4 kg) BMI (Calculated): 28.26 Weight at Last Visit: 164lb Weight Lost Since Last Visit: 0 Weight Gained Since Last Visit: 11lb Starting Weight: 222lb Total Weight Loss (lbs): 47 lb (21.3 kg)   Body Composition  Body Fat %: 38 % Fat Mass (lbs): 66.6 lbs Muscle Mass (lbs): 103 lbs Total Body Water  (lbs): 75 lbs Visceral Fat Rating : 9   Other Clinical Data Fasting: no Labs: no Today's Visit #: 15 Starting Date: 02/28/23    OBESITY Amanda Dillon is here to discuss her progress with her obesity treatment plan along with follow-up of her obesity related diagnoses.    Nutrition Plan: the Category 4 plan - 0% adherence.  Current exercise: none  Interim History:  She is up 11 lbs since her last visit on 03/26/24.  Not eating all of the food on the plan., Protein intake is as prescribed, Not journaling consistently., and Water  intake is inadequate.   Pharmacotherapy: Amanda Dillon is not on any anti-obesity.  Hunger is moderately controlled.  Cravings are moderately controlled.  Assessment/Plan:   There are no diagnoses linked to this encounter.    {dwwmorbid:29108::Morbid Obesity}: Current BMI BMI (Calculated): 28.26   Pharmacotherapy Plan {dwwmed:29123}  {dwwpharmacotherapy:29109}  Amanda Dillon {CHL AMB IS/IS NOT:210130109} currently in the action stage of change. As such, her goal is to {MWMwtloss#1:210800005}.  She has agreed to {dwwsldiets:29085}.  Exercise goals: {MWM EXERCISE RECS:23473}  Behavioral modification strategies: {dwwslwtlossstrategies:29088}.  Amanda Dillon has agreed to follow-up with our clinic in 4 weeks.     Objective:    VITALS: Per patient if applicable, see vitals. GENERAL: Alert and in no acute distress. CARDIOPULMONARY: No increased WOB. Speaking in clear sentences.  PSYCH: Pleasant and cooperative. Speech normal rate and rhythm. Affect is appropriate. Insight and judgement are appropriate. Attention is focused, linear, and appropriate.  NEURO: Oriented as arrived to appointment on time with no prompting.   Attestation Statements:   This was prepared with the assistance of Engineer, Civil (consulting).  Occasional wrong-word or sound-a-like substitutions may have occurred due to the inherent limitations of voice recognition   "

## 2024-06-23 ENCOUNTER — Ambulatory Visit: Admitting: Bariatrics

## 2024-06-25 ENCOUNTER — Ambulatory Visit: Admitting: Psychiatry

## 2024-07-21 ENCOUNTER — Ambulatory Visit: Admitting: Bariatrics

## 2024-07-30 ENCOUNTER — Ambulatory Visit: Admitting: Psychiatry

## 2024-09-24 ENCOUNTER — Ambulatory Visit: Admitting: Psychiatry

## 2024-10-15 ENCOUNTER — Ambulatory Visit: Admitting: Physician Assistant
